# Patient Record
Sex: Male | Born: 1945 | Race: White | Hispanic: No | Marital: Married | Smoking: Never smoker
Health system: Southern US, Community
[De-identification: ages and names within clinical notes are randomized; demographics above are authoritative.]

## PROBLEM LIST (undated history)

## (undated) DIAGNOSIS — K649 Unspecified hemorrhoids: Secondary | ICD-10-CM

## (undated) DIAGNOSIS — E7800 Pure hypercholesterolemia, unspecified: Secondary | ICD-10-CM

## (undated) DIAGNOSIS — I10 Essential (primary) hypertension: Secondary | ICD-10-CM

## (undated) DIAGNOSIS — M1990 Unspecified osteoarthritis, unspecified site: Secondary | ICD-10-CM

## (undated) DIAGNOSIS — R350 Frequency of micturition: Secondary | ICD-10-CM

## (undated) DIAGNOSIS — M2550 Pain in unspecified joint: Secondary | ICD-10-CM

## (undated) DIAGNOSIS — C801 Malignant (primary) neoplasm, unspecified: Secondary | ICD-10-CM

## (undated) DIAGNOSIS — R112 Nausea with vomiting, unspecified: Secondary | ICD-10-CM

## (undated) DIAGNOSIS — F419 Anxiety disorder, unspecified: Secondary | ICD-10-CM

## (undated) DIAGNOSIS — Z9889 Other specified postprocedural states: Secondary | ICD-10-CM

## (undated) DIAGNOSIS — K219 Gastro-esophageal reflux disease without esophagitis: Secondary | ICD-10-CM

## (undated) DIAGNOSIS — E119 Type 2 diabetes mellitus without complications: Secondary | ICD-10-CM

## (undated) HISTORY — PX: KNEE ARTHROSCOPY: SUR90

## (undated) HISTORY — PX: BACK SURGERY: SHX140

---

## 2009-03-21 HISTORY — PX: HERNIA REPAIR: SHX51

## 2010-11-14 DIAGNOSIS — I10 Essential (primary) hypertension: Secondary | ICD-10-CM | POA: Insufficient documentation

## 2010-11-14 DIAGNOSIS — E785 Hyperlipidemia, unspecified: Secondary | ICD-10-CM | POA: Insufficient documentation

## 2010-11-14 DIAGNOSIS — J309 Allergic rhinitis, unspecified: Secondary | ICD-10-CM | POA: Insufficient documentation

## 2010-11-14 DIAGNOSIS — E1129 Type 2 diabetes mellitus with other diabetic kidney complication: Secondary | ICD-10-CM | POA: Insufficient documentation

## 2010-11-14 DIAGNOSIS — K219 Gastro-esophageal reflux disease without esophagitis: Secondary | ICD-10-CM | POA: Insufficient documentation

## 2011-01-23 DIAGNOSIS — L259 Unspecified contact dermatitis, unspecified cause: Secondary | ICD-10-CM | POA: Insufficient documentation

## 2011-04-08 DIAGNOSIS — J019 Acute sinusitis, unspecified: Secondary | ICD-10-CM | POA: Insufficient documentation

## 2011-05-26 DIAGNOSIS — E7800 Pure hypercholesterolemia, unspecified: Secondary | ICD-10-CM | POA: Diagnosis not present

## 2011-05-26 DIAGNOSIS — K2100 Gastro-esophageal reflux disease with esophagitis, without bleeding: Secondary | ICD-10-CM | POA: Diagnosis not present

## 2011-05-26 DIAGNOSIS — E119 Type 2 diabetes mellitus without complications: Secondary | ICD-10-CM | POA: Diagnosis not present

## 2011-05-26 DIAGNOSIS — I10 Essential (primary) hypertension: Secondary | ICD-10-CM | POA: Diagnosis not present

## 2011-06-27 DIAGNOSIS — R300 Dysuria: Secondary | ICD-10-CM | POA: Diagnosis not present

## 2011-07-30 DIAGNOSIS — M722 Plantar fascial fibromatosis: Secondary | ICD-10-CM | POA: Diagnosis not present

## 2011-08-11 DIAGNOSIS — D239 Other benign neoplasm of skin, unspecified: Secondary | ICD-10-CM | POA: Diagnosis not present

## 2011-08-11 DIAGNOSIS — L570 Actinic keratosis: Secondary | ICD-10-CM | POA: Diagnosis not present

## 2011-08-11 DIAGNOSIS — L821 Other seborrheic keratosis: Secondary | ICD-10-CM | POA: Diagnosis not present

## 2011-09-11 DIAGNOSIS — R5381 Other malaise: Secondary | ICD-10-CM | POA: Insufficient documentation

## 2011-11-10 DIAGNOSIS — M25519 Pain in unspecified shoulder: Secondary | ICD-10-CM | POA: Diagnosis not present

## 2011-11-24 DIAGNOSIS — K2100 Gastro-esophageal reflux disease with esophagitis, without bleeding: Secondary | ICD-10-CM | POA: Diagnosis not present

## 2011-11-24 DIAGNOSIS — E119 Type 2 diabetes mellitus without complications: Secondary | ICD-10-CM | POA: Diagnosis not present

## 2011-11-24 DIAGNOSIS — L821 Other seborrheic keratosis: Secondary | ICD-10-CM | POA: Diagnosis not present

## 2011-11-24 DIAGNOSIS — E7800 Pure hypercholesterolemia, unspecified: Secondary | ICD-10-CM | POA: Diagnosis not present

## 2011-11-24 DIAGNOSIS — L570 Actinic keratosis: Secondary | ICD-10-CM | POA: Diagnosis not present

## 2011-12-01 DIAGNOSIS — K5900 Constipation, unspecified: Secondary | ICD-10-CM | POA: Insufficient documentation

## 2011-12-01 DIAGNOSIS — R809 Proteinuria, unspecified: Secondary | ICD-10-CM | POA: Insufficient documentation

## 2012-01-17 DIAGNOSIS — M542 Cervicalgia: Secondary | ICD-10-CM | POA: Diagnosis not present

## 2012-01-20 DIAGNOSIS — S139XXA Sprain of joints and ligaments of unspecified parts of neck, initial encounter: Secondary | ICD-10-CM | POA: Insufficient documentation

## 2012-01-20 DIAGNOSIS — IMO0002 Reserved for concepts with insufficient information to code with codable children: Secondary | ICD-10-CM | POA: Insufficient documentation

## 2012-01-23 DIAGNOSIS — IMO0001 Reserved for inherently not codable concepts without codable children: Secondary | ICD-10-CM | POA: Diagnosis not present

## 2012-01-23 DIAGNOSIS — S139XXA Sprain of joints and ligaments of unspecified parts of neck, initial encounter: Secondary | ICD-10-CM | POA: Diagnosis not present

## 2012-01-26 DIAGNOSIS — IMO0001 Reserved for inherently not codable concepts without codable children: Secondary | ICD-10-CM | POA: Diagnosis not present

## 2012-01-26 DIAGNOSIS — S139XXA Sprain of joints and ligaments of unspecified parts of neck, initial encounter: Secondary | ICD-10-CM | POA: Diagnosis not present

## 2012-01-28 DIAGNOSIS — S139XXA Sprain of joints and ligaments of unspecified parts of neck, initial encounter: Secondary | ICD-10-CM | POA: Diagnosis not present

## 2012-01-28 DIAGNOSIS — IMO0001 Reserved for inherently not codable concepts without codable children: Secondary | ICD-10-CM | POA: Diagnosis not present

## 2012-01-30 DIAGNOSIS — IMO0001 Reserved for inherently not codable concepts without codable children: Secondary | ICD-10-CM | POA: Diagnosis not present

## 2012-01-30 DIAGNOSIS — S139XXA Sprain of joints and ligaments of unspecified parts of neck, initial encounter: Secondary | ICD-10-CM | POA: Diagnosis not present

## 2012-02-02 DIAGNOSIS — IMO0001 Reserved for inherently not codable concepts without codable children: Secondary | ICD-10-CM | POA: Diagnosis not present

## 2012-02-02 DIAGNOSIS — S139XXA Sprain of joints and ligaments of unspecified parts of neck, initial encounter: Secondary | ICD-10-CM | POA: Diagnosis not present

## 2012-02-04 DIAGNOSIS — S139XXA Sprain of joints and ligaments of unspecified parts of neck, initial encounter: Secondary | ICD-10-CM | POA: Diagnosis not present

## 2012-02-04 DIAGNOSIS — IMO0001 Reserved for inherently not codable concepts without codable children: Secondary | ICD-10-CM | POA: Diagnosis not present

## 2012-02-06 DIAGNOSIS — IMO0001 Reserved for inherently not codable concepts without codable children: Secondary | ICD-10-CM | POA: Diagnosis not present

## 2012-02-06 DIAGNOSIS — S139XXA Sprain of joints and ligaments of unspecified parts of neck, initial encounter: Secondary | ICD-10-CM | POA: Diagnosis not present

## 2012-02-09 DIAGNOSIS — IMO0001 Reserved for inherently not codable concepts without codable children: Secondary | ICD-10-CM | POA: Diagnosis not present

## 2012-02-09 DIAGNOSIS — S139XXA Sprain of joints and ligaments of unspecified parts of neck, initial encounter: Secondary | ICD-10-CM | POA: Diagnosis not present

## 2012-02-11 DIAGNOSIS — S139XXA Sprain of joints and ligaments of unspecified parts of neck, initial encounter: Secondary | ICD-10-CM | POA: Diagnosis not present

## 2012-02-11 DIAGNOSIS — IMO0001 Reserved for inherently not codable concepts without codable children: Secondary | ICD-10-CM | POA: Diagnosis not present

## 2012-02-13 DIAGNOSIS — IMO0001 Reserved for inherently not codable concepts without codable children: Secondary | ICD-10-CM | POA: Diagnosis not present

## 2012-02-13 DIAGNOSIS — S139XXA Sprain of joints and ligaments of unspecified parts of neck, initial encounter: Secondary | ICD-10-CM | POA: Diagnosis not present

## 2012-02-17 DIAGNOSIS — IMO0001 Reserved for inherently not codable concepts without codable children: Secondary | ICD-10-CM | POA: Diagnosis not present

## 2012-02-17 DIAGNOSIS — M542 Cervicalgia: Secondary | ICD-10-CM | POA: Diagnosis not present

## 2012-03-31 DIAGNOSIS — K648 Other hemorrhoids: Secondary | ICD-10-CM | POA: Diagnosis not present

## 2012-04-14 DIAGNOSIS — K648 Other hemorrhoids: Secondary | ICD-10-CM | POA: Diagnosis not present

## 2012-04-28 DIAGNOSIS — K602 Anal fissure, unspecified: Secondary | ICD-10-CM | POA: Diagnosis not present

## 2012-04-28 DIAGNOSIS — K648 Other hemorrhoids: Secondary | ICD-10-CM | POA: Diagnosis not present

## 2012-05-10 DIAGNOSIS — M25519 Pain in unspecified shoulder: Secondary | ICD-10-CM | POA: Diagnosis not present

## 2012-05-10 DIAGNOSIS — M19049 Primary osteoarthritis, unspecified hand: Secondary | ICD-10-CM | POA: Diagnosis not present

## 2012-05-10 DIAGNOSIS — M542 Cervicalgia: Secondary | ICD-10-CM | POA: Diagnosis not present

## 2012-05-10 DIAGNOSIS — IMO0002 Reserved for concepts with insufficient information to code with codable children: Secondary | ICD-10-CM | POA: Diagnosis not present

## 2012-05-15 DIAGNOSIS — J111 Influenza due to unidentified influenza virus with other respiratory manifestations: Secondary | ICD-10-CM | POA: Diagnosis not present

## 2012-06-01 DIAGNOSIS — K625 Hemorrhage of anus and rectum: Secondary | ICD-10-CM | POA: Diagnosis not present

## 2012-06-01 DIAGNOSIS — K5900 Constipation, unspecified: Secondary | ICD-10-CM | POA: Diagnosis not present

## 2012-06-21 DIAGNOSIS — D126 Benign neoplasm of colon, unspecified: Secondary | ICD-10-CM | POA: Diagnosis not present

## 2012-06-21 DIAGNOSIS — K5730 Diverticulosis of large intestine without perforation or abscess without bleeding: Secondary | ICD-10-CM | POA: Diagnosis not present

## 2012-06-21 DIAGNOSIS — K648 Other hemorrhoids: Secondary | ICD-10-CM | POA: Diagnosis not present

## 2012-08-19 DIAGNOSIS — R5381 Other malaise: Secondary | ICD-10-CM | POA: Diagnosis not present

## 2012-08-19 DIAGNOSIS — R42 Dizziness and giddiness: Secondary | ICD-10-CM | POA: Diagnosis not present

## 2012-08-19 DIAGNOSIS — F411 Generalized anxiety disorder: Secondary | ICD-10-CM | POA: Diagnosis not present

## 2012-08-19 DIAGNOSIS — R5383 Other fatigue: Secondary | ICD-10-CM | POA: Diagnosis not present

## 2012-09-10 DIAGNOSIS — K5289 Other specified noninfective gastroenteritis and colitis: Secondary | ICD-10-CM | POA: Diagnosis not present

## 2012-09-10 DIAGNOSIS — K529 Noninfective gastroenteritis and colitis, unspecified: Secondary | ICD-10-CM | POA: Insufficient documentation

## 2012-11-15 DIAGNOSIS — IMO0002 Reserved for concepts with insufficient information to code with codable children: Secondary | ICD-10-CM | POA: Diagnosis not present

## 2012-11-15 DIAGNOSIS — M75120 Complete rotator cuff tear or rupture of unspecified shoulder, not specified as traumatic: Secondary | ICD-10-CM | POA: Diagnosis not present

## 2012-11-15 DIAGNOSIS — M19049 Primary osteoarthritis, unspecified hand: Secondary | ICD-10-CM | POA: Diagnosis not present

## 2012-11-15 DIAGNOSIS — M542 Cervicalgia: Secondary | ICD-10-CM | POA: Diagnosis not present

## 2012-11-22 DIAGNOSIS — E559 Vitamin D deficiency, unspecified: Secondary | ICD-10-CM | POA: Insufficient documentation

## 2012-11-23 DIAGNOSIS — L821 Other seborrheic keratosis: Secondary | ICD-10-CM | POA: Diagnosis not present

## 2012-11-23 DIAGNOSIS — L570 Actinic keratosis: Secondary | ICD-10-CM | POA: Diagnosis not present

## 2012-11-23 DIAGNOSIS — D1800 Hemangioma unspecified site: Secondary | ICD-10-CM | POA: Diagnosis not present

## 2012-12-01 DIAGNOSIS — E119 Type 2 diabetes mellitus without complications: Secondary | ICD-10-CM | POA: Diagnosis not present

## 2012-12-01 DIAGNOSIS — K2100 Gastro-esophageal reflux disease with esophagitis, without bleeding: Secondary | ICD-10-CM | POA: Diagnosis not present

## 2012-12-01 DIAGNOSIS — M25519 Pain in unspecified shoulder: Secondary | ICD-10-CM | POA: Diagnosis not present

## 2012-12-01 DIAGNOSIS — E7800 Pure hypercholesterolemia, unspecified: Secondary | ICD-10-CM | POA: Diagnosis not present

## 2012-12-01 DIAGNOSIS — I10 Essential (primary) hypertension: Secondary | ICD-10-CM | POA: Diagnosis not present

## 2012-12-01 DIAGNOSIS — R809 Proteinuria, unspecified: Secondary | ICD-10-CM | POA: Diagnosis not present

## 2012-12-01 DIAGNOSIS — J309 Allergic rhinitis, unspecified: Secondary | ICD-10-CM | POA: Diagnosis not present

## 2012-12-07 DIAGNOSIS — M25519 Pain in unspecified shoulder: Secondary | ICD-10-CM | POA: Diagnosis not present

## 2013-02-18 DIAGNOSIS — Z23 Encounter for immunization: Secondary | ICD-10-CM | POA: Insufficient documentation

## 2013-05-09 DIAGNOSIS — M25519 Pain in unspecified shoulder: Secondary | ICD-10-CM | POA: Diagnosis not present

## 2013-05-09 DIAGNOSIS — M545 Low back pain, unspecified: Secondary | ICD-10-CM | POA: Diagnosis not present

## 2013-05-09 DIAGNOSIS — M542 Cervicalgia: Secondary | ICD-10-CM | POA: Diagnosis not present

## 2013-05-09 DIAGNOSIS — M19049 Primary osteoarthritis, unspecified hand: Secondary | ICD-10-CM | POA: Diagnosis not present

## 2013-05-24 DIAGNOSIS — T7492XA Unspecified child maltreatment, confirmed, initial encounter: Secondary | ICD-10-CM | POA: Insufficient documentation

## 2013-05-26 DIAGNOSIS — E7800 Pure hypercholesterolemia, unspecified: Secondary | ICD-10-CM | POA: Diagnosis not present

## 2013-05-26 DIAGNOSIS — I10 Essential (primary) hypertension: Secondary | ICD-10-CM | POA: Diagnosis not present

## 2013-05-26 DIAGNOSIS — K2100 Gastro-esophageal reflux disease with esophagitis, without bleeding: Secondary | ICD-10-CM | POA: Diagnosis not present

## 2013-05-26 DIAGNOSIS — E119 Type 2 diabetes mellitus without complications: Secondary | ICD-10-CM | POA: Diagnosis not present

## 2013-06-01 DIAGNOSIS — K2100 Gastro-esophageal reflux disease with esophagitis, without bleeding: Secondary | ICD-10-CM | POA: Insufficient documentation

## 2013-06-01 DIAGNOSIS — R809 Proteinuria, unspecified: Secondary | ICD-10-CM | POA: Diagnosis not present

## 2013-06-01 DIAGNOSIS — E7800 Pure hypercholesterolemia, unspecified: Secondary | ICD-10-CM | POA: Diagnosis not present

## 2013-06-01 DIAGNOSIS — I10 Essential (primary) hypertension: Secondary | ICD-10-CM | POA: Diagnosis not present

## 2013-06-01 DIAGNOSIS — E119 Type 2 diabetes mellitus without complications: Secondary | ICD-10-CM | POA: Diagnosis not present

## 2013-06-01 DIAGNOSIS — J309 Allergic rhinitis, unspecified: Secondary | ICD-10-CM | POA: Diagnosis not present

## 2013-08-31 DIAGNOSIS — M25819 Other specified joint disorders, unspecified shoulder: Secondary | ICD-10-CM | POA: Diagnosis not present

## 2013-08-31 DIAGNOSIS — M25519 Pain in unspecified shoulder: Secondary | ICD-10-CM | POA: Diagnosis not present

## 2013-09-15 DIAGNOSIS — M542 Cervicalgia: Secondary | ICD-10-CM | POA: Diagnosis not present

## 2013-09-15 DIAGNOSIS — M25819 Other specified joint disorders, unspecified shoulder: Secondary | ICD-10-CM | POA: Diagnosis not present

## 2013-11-08 DIAGNOSIS — M19019 Primary osteoarthritis, unspecified shoulder: Secondary | ICD-10-CM | POA: Diagnosis not present

## 2013-11-08 DIAGNOSIS — IMO0002 Reserved for concepts with insufficient information to code with codable children: Secondary | ICD-10-CM | POA: Diagnosis not present

## 2013-11-08 DIAGNOSIS — M25519 Pain in unspecified shoulder: Secondary | ICD-10-CM | POA: Diagnosis not present

## 2013-11-08 DIAGNOSIS — M25819 Other specified joint disorders, unspecified shoulder: Secondary | ICD-10-CM | POA: Diagnosis not present

## 2013-11-21 DIAGNOSIS — N410 Acute prostatitis: Secondary | ICD-10-CM | POA: Insufficient documentation

## 2013-11-21 DIAGNOSIS — E7800 Pure hypercholesterolemia, unspecified: Secondary | ICD-10-CM | POA: Insufficient documentation

## 2013-11-21 DIAGNOSIS — I10 Essential (primary) hypertension: Secondary | ICD-10-CM | POA: Insufficient documentation

## 2013-11-21 DIAGNOSIS — F411 Generalized anxiety disorder: Secondary | ICD-10-CM | POA: Insufficient documentation

## 2013-11-23 DIAGNOSIS — I10 Essential (primary) hypertension: Secondary | ICD-10-CM | POA: Diagnosis not present

## 2013-11-23 DIAGNOSIS — L821 Other seborrheic keratosis: Secondary | ICD-10-CM | POA: Diagnosis not present

## 2013-11-23 DIAGNOSIS — E119 Type 2 diabetes mellitus without complications: Secondary | ICD-10-CM | POA: Diagnosis not present

## 2013-11-23 DIAGNOSIS — N410 Acute prostatitis: Secondary | ICD-10-CM | POA: Diagnosis not present

## 2013-11-23 DIAGNOSIS — E7800 Pure hypercholesterolemia, unspecified: Secondary | ICD-10-CM | POA: Diagnosis not present

## 2013-11-23 DIAGNOSIS — F411 Generalized anxiety disorder: Secondary | ICD-10-CM | POA: Diagnosis not present

## 2013-11-23 DIAGNOSIS — L570 Actinic keratosis: Secondary | ICD-10-CM | POA: Diagnosis not present

## 2013-11-23 DIAGNOSIS — D1800 Hemangioma unspecified site: Secondary | ICD-10-CM | POA: Diagnosis not present

## 2013-11-30 DIAGNOSIS — R972 Elevated prostate specific antigen [PSA]: Secondary | ICD-10-CM | POA: Insufficient documentation

## 2013-12-05 DIAGNOSIS — M719 Bursopathy, unspecified: Secondary | ICD-10-CM | POA: Diagnosis not present

## 2013-12-05 DIAGNOSIS — M19019 Primary osteoarthritis, unspecified shoulder: Secondary | ICD-10-CM | POA: Diagnosis not present

## 2013-12-05 DIAGNOSIS — M25819 Other specified joint disorders, unspecified shoulder: Secondary | ICD-10-CM | POA: Diagnosis not present

## 2013-12-05 DIAGNOSIS — M67919 Unspecified disorder of synovium and tendon, unspecified shoulder: Secondary | ICD-10-CM | POA: Diagnosis not present

## 2013-12-07 DIAGNOSIS — R972 Elevated prostate specific antigen [PSA]: Secondary | ICD-10-CM | POA: Diagnosis not present

## 2013-12-07 DIAGNOSIS — R3915 Urgency of urination: Secondary | ICD-10-CM | POA: Diagnosis not present

## 2013-12-07 DIAGNOSIS — R351 Nocturia: Secondary | ICD-10-CM | POA: Diagnosis not present

## 2013-12-07 DIAGNOSIS — R3989 Other symptoms and signs involving the genitourinary system: Secondary | ICD-10-CM | POA: Diagnosis not present

## 2014-01-02 DIAGNOSIS — R3915 Urgency of urination: Secondary | ICD-10-CM | POA: Diagnosis not present

## 2014-01-02 DIAGNOSIS — R972 Elevated prostate specific antigen [PSA]: Secondary | ICD-10-CM | POA: Diagnosis not present

## 2014-01-09 DIAGNOSIS — J3489 Other specified disorders of nose and nasal sinuses: Secondary | ICD-10-CM | POA: Diagnosis not present

## 2014-01-09 DIAGNOSIS — R3915 Urgency of urination: Secondary | ICD-10-CM | POA: Diagnosis not present

## 2014-01-09 DIAGNOSIS — R972 Elevated prostate specific antigen [PSA]: Secondary | ICD-10-CM | POA: Diagnosis not present

## 2014-01-24 DIAGNOSIS — Z23 Encounter for immunization: Secondary | ICD-10-CM | POA: Insufficient documentation

## 2014-03-31 DIAGNOSIS — M7541 Impingement syndrome of right shoulder: Secondary | ICD-10-CM | POA: Diagnosis not present

## 2014-03-31 DIAGNOSIS — M19011 Primary osteoarthritis, right shoulder: Secondary | ICD-10-CM | POA: Diagnosis not present

## 2014-04-27 DIAGNOSIS — M545 Low back pain: Secondary | ICD-10-CM | POA: Diagnosis not present

## 2014-04-27 DIAGNOSIS — M75101 Unspecified rotator cuff tear or rupture of right shoulder, not specified as traumatic: Secondary | ICD-10-CM | POA: Diagnosis not present

## 2014-05-10 DIAGNOSIS — M25511 Pain in right shoulder: Secondary | ICD-10-CM | POA: Diagnosis not present

## 2014-05-22 DIAGNOSIS — M75101 Unspecified rotator cuff tear or rupture of right shoulder, not specified as traumatic: Secondary | ICD-10-CM | POA: Diagnosis not present

## 2014-05-23 DIAGNOSIS — I10 Essential (primary) hypertension: Secondary | ICD-10-CM | POA: Diagnosis not present

## 2014-05-23 DIAGNOSIS — K210 Gastro-esophageal reflux disease with esophagitis: Secondary | ICD-10-CM | POA: Diagnosis not present

## 2014-05-23 DIAGNOSIS — R801 Persistent proteinuria, unspecified: Secondary | ICD-10-CM | POA: Diagnosis not present

## 2014-05-23 DIAGNOSIS — E119 Type 2 diabetes mellitus without complications: Secondary | ICD-10-CM | POA: Diagnosis not present

## 2014-05-23 DIAGNOSIS — E1121 Type 2 diabetes mellitus with diabetic nephropathy: Secondary | ICD-10-CM | POA: Diagnosis not present

## 2014-05-23 DIAGNOSIS — E780 Pure hypercholesterolemia: Secondary | ICD-10-CM | POA: Diagnosis not present

## 2014-05-29 DIAGNOSIS — M7541 Impingement syndrome of right shoulder: Secondary | ICD-10-CM | POA: Diagnosis not present

## 2014-06-06 DIAGNOSIS — M66821 Spontaneous rupture of other tendons, right upper arm: Secondary | ICD-10-CM | POA: Diagnosis not present

## 2014-06-06 DIAGNOSIS — M24111 Other articular cartilage disorders, right shoulder: Secondary | ICD-10-CM | POA: Diagnosis not present

## 2014-06-06 DIAGNOSIS — M65821 Other synovitis and tenosynovitis, right upper arm: Secondary | ICD-10-CM | POA: Diagnosis not present

## 2014-06-06 DIAGNOSIS — G8918 Other acute postprocedural pain: Secondary | ICD-10-CM | POA: Diagnosis not present

## 2014-06-06 DIAGNOSIS — M19011 Primary osteoarthritis, right shoulder: Secondary | ICD-10-CM | POA: Diagnosis not present

## 2014-06-06 DIAGNOSIS — M7541 Impingement syndrome of right shoulder: Secondary | ICD-10-CM | POA: Diagnosis not present

## 2014-06-06 HISTORY — PX: SHOULDER SURGERY: SHX246

## 2014-06-16 DIAGNOSIS — M25611 Stiffness of right shoulder, not elsewhere classified: Secondary | ICD-10-CM | POA: Diagnosis not present

## 2014-06-16 DIAGNOSIS — M25411 Effusion, right shoulder: Secondary | ICD-10-CM | POA: Diagnosis not present

## 2014-06-16 DIAGNOSIS — R293 Abnormal posture: Secondary | ICD-10-CM | POA: Diagnosis not present

## 2014-06-16 DIAGNOSIS — M25511 Pain in right shoulder: Secondary | ICD-10-CM | POA: Diagnosis not present

## 2014-06-19 DIAGNOSIS — R293 Abnormal posture: Secondary | ICD-10-CM | POA: Diagnosis not present

## 2014-06-19 DIAGNOSIS — M25611 Stiffness of right shoulder, not elsewhere classified: Secondary | ICD-10-CM | POA: Diagnosis not present

## 2014-06-19 DIAGNOSIS — M25511 Pain in right shoulder: Secondary | ICD-10-CM | POA: Diagnosis not present

## 2014-06-19 DIAGNOSIS — M25411 Effusion, right shoulder: Secondary | ICD-10-CM | POA: Diagnosis not present

## 2014-06-20 DIAGNOSIS — M25611 Stiffness of right shoulder, not elsewhere classified: Secondary | ICD-10-CM | POA: Diagnosis not present

## 2014-06-20 DIAGNOSIS — M25511 Pain in right shoulder: Secondary | ICD-10-CM | POA: Diagnosis not present

## 2014-06-20 DIAGNOSIS — M25411 Effusion, right shoulder: Secondary | ICD-10-CM | POA: Diagnosis not present

## 2014-06-20 DIAGNOSIS — R293 Abnormal posture: Secondary | ICD-10-CM | POA: Diagnosis not present

## 2014-06-22 DIAGNOSIS — M25611 Stiffness of right shoulder, not elsewhere classified: Secondary | ICD-10-CM | POA: Diagnosis not present

## 2014-06-22 DIAGNOSIS — R293 Abnormal posture: Secondary | ICD-10-CM | POA: Diagnosis not present

## 2014-06-22 DIAGNOSIS — M25411 Effusion, right shoulder: Secondary | ICD-10-CM | POA: Diagnosis not present

## 2014-06-22 DIAGNOSIS — M25511 Pain in right shoulder: Secondary | ICD-10-CM | POA: Diagnosis not present

## 2014-06-26 DIAGNOSIS — M25511 Pain in right shoulder: Secondary | ICD-10-CM | POA: Diagnosis not present

## 2014-06-26 DIAGNOSIS — M25411 Effusion, right shoulder: Secondary | ICD-10-CM | POA: Diagnosis not present

## 2014-06-26 DIAGNOSIS — R293 Abnormal posture: Secondary | ICD-10-CM | POA: Diagnosis not present

## 2014-06-26 DIAGNOSIS — M25611 Stiffness of right shoulder, not elsewhere classified: Secondary | ICD-10-CM | POA: Diagnosis not present

## 2014-06-28 DIAGNOSIS — M25411 Effusion, right shoulder: Secondary | ICD-10-CM | POA: Diagnosis not present

## 2014-06-28 DIAGNOSIS — M25511 Pain in right shoulder: Secondary | ICD-10-CM | POA: Diagnosis not present

## 2014-06-28 DIAGNOSIS — R293 Abnormal posture: Secondary | ICD-10-CM | POA: Diagnosis not present

## 2014-06-28 DIAGNOSIS — M25611 Stiffness of right shoulder, not elsewhere classified: Secondary | ICD-10-CM | POA: Diagnosis not present

## 2014-06-30 DIAGNOSIS — M25611 Stiffness of right shoulder, not elsewhere classified: Secondary | ICD-10-CM | POA: Diagnosis not present

## 2014-06-30 DIAGNOSIS — M25511 Pain in right shoulder: Secondary | ICD-10-CM | POA: Diagnosis not present

## 2014-06-30 DIAGNOSIS — R293 Abnormal posture: Secondary | ICD-10-CM | POA: Diagnosis not present

## 2014-06-30 DIAGNOSIS — M25411 Effusion, right shoulder: Secondary | ICD-10-CM | POA: Diagnosis not present

## 2014-07-05 DIAGNOSIS — M25511 Pain in right shoulder: Secondary | ICD-10-CM | POA: Diagnosis not present

## 2014-07-05 DIAGNOSIS — M25611 Stiffness of right shoulder, not elsewhere classified: Secondary | ICD-10-CM | POA: Diagnosis not present

## 2014-07-05 DIAGNOSIS — R293 Abnormal posture: Secondary | ICD-10-CM | POA: Diagnosis not present

## 2014-07-05 DIAGNOSIS — M25411 Effusion, right shoulder: Secondary | ICD-10-CM | POA: Diagnosis not present

## 2014-07-07 DIAGNOSIS — M25511 Pain in right shoulder: Secondary | ICD-10-CM | POA: Diagnosis not present

## 2014-07-07 DIAGNOSIS — R293 Abnormal posture: Secondary | ICD-10-CM | POA: Diagnosis not present

## 2014-07-07 DIAGNOSIS — M25411 Effusion, right shoulder: Secondary | ICD-10-CM | POA: Diagnosis not present

## 2014-07-07 DIAGNOSIS — M25611 Stiffness of right shoulder, not elsewhere classified: Secondary | ICD-10-CM | POA: Diagnosis not present

## 2014-07-07 DIAGNOSIS — R972 Elevated prostate specific antigen [PSA]: Secondary | ICD-10-CM | POA: Diagnosis not present

## 2014-07-11 DIAGNOSIS — M25511 Pain in right shoulder: Secondary | ICD-10-CM | POA: Diagnosis not present

## 2014-07-11 DIAGNOSIS — M25411 Effusion, right shoulder: Secondary | ICD-10-CM | POA: Diagnosis not present

## 2014-07-11 DIAGNOSIS — M25611 Stiffness of right shoulder, not elsewhere classified: Secondary | ICD-10-CM | POA: Diagnosis not present

## 2014-07-11 DIAGNOSIS — R293 Abnormal posture: Secondary | ICD-10-CM | POA: Diagnosis not present

## 2014-07-12 DIAGNOSIS — M25511 Pain in right shoulder: Secondary | ICD-10-CM | POA: Diagnosis not present

## 2014-07-12 DIAGNOSIS — M25411 Effusion, right shoulder: Secondary | ICD-10-CM | POA: Diagnosis not present

## 2014-07-12 DIAGNOSIS — R293 Abnormal posture: Secondary | ICD-10-CM | POA: Diagnosis not present

## 2014-07-12 DIAGNOSIS — M25611 Stiffness of right shoulder, not elsewhere classified: Secondary | ICD-10-CM | POA: Diagnosis not present

## 2014-07-13 DIAGNOSIS — R3989 Other symptoms and signs involving the genitourinary system: Secondary | ICD-10-CM | POA: Diagnosis not present

## 2014-07-13 DIAGNOSIS — R972 Elevated prostate specific antigen [PSA]: Secondary | ICD-10-CM | POA: Diagnosis not present

## 2014-07-14 DIAGNOSIS — M25611 Stiffness of right shoulder, not elsewhere classified: Secondary | ICD-10-CM | POA: Diagnosis not present

## 2014-07-14 DIAGNOSIS — M25411 Effusion, right shoulder: Secondary | ICD-10-CM | POA: Diagnosis not present

## 2014-07-14 DIAGNOSIS — R293 Abnormal posture: Secondary | ICD-10-CM | POA: Diagnosis not present

## 2014-07-14 DIAGNOSIS — M25511 Pain in right shoulder: Secondary | ICD-10-CM | POA: Diagnosis not present

## 2014-07-17 DIAGNOSIS — M25411 Effusion, right shoulder: Secondary | ICD-10-CM | POA: Diagnosis not present

## 2014-07-17 DIAGNOSIS — R293 Abnormal posture: Secondary | ICD-10-CM | POA: Diagnosis not present

## 2014-07-17 DIAGNOSIS — M25511 Pain in right shoulder: Secondary | ICD-10-CM | POA: Diagnosis not present

## 2014-07-17 DIAGNOSIS — M25611 Stiffness of right shoulder, not elsewhere classified: Secondary | ICD-10-CM | POA: Diagnosis not present

## 2014-07-21 DIAGNOSIS — M25511 Pain in right shoulder: Secondary | ICD-10-CM | POA: Diagnosis not present

## 2014-07-21 DIAGNOSIS — R293 Abnormal posture: Secondary | ICD-10-CM | POA: Diagnosis not present

## 2014-07-21 DIAGNOSIS — M25611 Stiffness of right shoulder, not elsewhere classified: Secondary | ICD-10-CM | POA: Diagnosis not present

## 2014-07-21 DIAGNOSIS — M25411 Effusion, right shoulder: Secondary | ICD-10-CM | POA: Diagnosis not present

## 2014-08-09 DIAGNOSIS — L03031 Cellulitis of right toe: Secondary | ICD-10-CM | POA: Diagnosis not present

## 2014-08-09 DIAGNOSIS — L600 Ingrowing nail: Secondary | ICD-10-CM | POA: Diagnosis not present

## 2014-08-09 DIAGNOSIS — M79674 Pain in right toe(s): Secondary | ICD-10-CM | POA: Diagnosis not present

## 2014-08-23 DIAGNOSIS — L03031 Cellulitis of right toe: Secondary | ICD-10-CM | POA: Diagnosis not present

## 2014-08-23 DIAGNOSIS — M79674 Pain in right toe(s): Secondary | ICD-10-CM | POA: Diagnosis not present

## 2014-08-23 DIAGNOSIS — L600 Ingrowing nail: Secondary | ICD-10-CM | POA: Diagnosis not present

## 2014-09-21 DIAGNOSIS — M1711 Unilateral primary osteoarthritis, right knee: Secondary | ICD-10-CM | POA: Diagnosis not present

## 2014-09-21 DIAGNOSIS — M1811 Unilateral primary osteoarthritis of first carpometacarpal joint, right hand: Secondary | ICD-10-CM | POA: Diagnosis not present

## 2014-10-05 DIAGNOSIS — L600 Ingrowing nail: Secondary | ICD-10-CM | POA: Diagnosis not present

## 2014-10-05 DIAGNOSIS — M79675 Pain in left toe(s): Secondary | ICD-10-CM | POA: Diagnosis not present

## 2014-11-14 DIAGNOSIS — K210 Gastro-esophageal reflux disease with esophagitis: Secondary | ICD-10-CM | POA: Diagnosis not present

## 2014-11-14 DIAGNOSIS — I10 Essential (primary) hypertension: Secondary | ICD-10-CM | POA: Diagnosis not present

## 2014-11-14 DIAGNOSIS — E1121 Type 2 diabetes mellitus with diabetic nephropathy: Secondary | ICD-10-CM | POA: Diagnosis not present

## 2014-11-14 DIAGNOSIS — E780 Pure hypercholesterolemia: Secondary | ICD-10-CM | POA: Diagnosis not present

## 2014-11-22 DIAGNOSIS — Z23 Encounter for immunization: Secondary | ICD-10-CM | POA: Diagnosis not present

## 2014-11-22 DIAGNOSIS — E780 Pure hypercholesterolemia: Secondary | ICD-10-CM | POA: Diagnosis not present

## 2014-11-22 DIAGNOSIS — E119 Type 2 diabetes mellitus without complications: Secondary | ICD-10-CM | POA: Diagnosis not present

## 2014-11-22 DIAGNOSIS — Z1389 Encounter for screening for other disorder: Secondary | ICD-10-CM | POA: Diagnosis not present

## 2014-11-22 DIAGNOSIS — E1121 Type 2 diabetes mellitus with diabetic nephropathy: Secondary | ICD-10-CM | POA: Diagnosis not present

## 2014-11-22 DIAGNOSIS — R801 Persistent proteinuria, unspecified: Secondary | ICD-10-CM | POA: Diagnosis not present

## 2014-11-22 DIAGNOSIS — I10 Essential (primary) hypertension: Secondary | ICD-10-CM | POA: Diagnosis not present

## 2014-11-22 DIAGNOSIS — K210 Gastro-esophageal reflux disease with esophagitis: Secondary | ICD-10-CM | POA: Diagnosis not present

## 2014-11-27 DIAGNOSIS — D1800 Hemangioma unspecified site: Secondary | ICD-10-CM | POA: Diagnosis not present

## 2014-11-27 DIAGNOSIS — L821 Other seborrheic keratosis: Secondary | ICD-10-CM | POA: Diagnosis not present

## 2014-11-27 DIAGNOSIS — L570 Actinic keratosis: Secondary | ICD-10-CM | POA: Diagnosis not present

## 2015-01-10 DIAGNOSIS — M1711 Unilateral primary osteoarthritis, right knee: Secondary | ICD-10-CM | POA: Diagnosis not present

## 2015-01-10 DIAGNOSIS — M1811 Unilateral primary osteoarthritis of first carpometacarpal joint, right hand: Secondary | ICD-10-CM | POA: Diagnosis not present

## 2015-01-17 DIAGNOSIS — M1711 Unilateral primary osteoarthritis, right knee: Secondary | ICD-10-CM | POA: Diagnosis not present

## 2015-01-31 DIAGNOSIS — M1711 Unilateral primary osteoarthritis, right knee: Secondary | ICD-10-CM | POA: Diagnosis not present

## 2015-02-07 DIAGNOSIS — M1711 Unilateral primary osteoarthritis, right knee: Secondary | ICD-10-CM | POA: Diagnosis not present

## 2015-02-08 DIAGNOSIS — Z23 Encounter for immunization: Secondary | ICD-10-CM | POA: Diagnosis not present

## 2015-02-14 DIAGNOSIS — M1711 Unilateral primary osteoarthritis, right knee: Secondary | ICD-10-CM | POA: Diagnosis not present

## 2015-03-14 DIAGNOSIS — M1711 Unilateral primary osteoarthritis, right knee: Secondary | ICD-10-CM | POA: Diagnosis not present

## 2015-04-30 DIAGNOSIS — M5416 Radiculopathy, lumbar region: Secondary | ICD-10-CM | POA: Diagnosis not present

## 2015-04-30 DIAGNOSIS — M1811 Unilateral primary osteoarthritis of first carpometacarpal joint, right hand: Secondary | ICD-10-CM | POA: Diagnosis not present

## 2015-04-30 DIAGNOSIS — G039 Meningitis, unspecified: Secondary | ICD-10-CM | POA: Diagnosis not present

## 2015-05-24 DIAGNOSIS — E1121 Type 2 diabetes mellitus with diabetic nephropathy: Secondary | ICD-10-CM | POA: Diagnosis not present

## 2015-05-24 DIAGNOSIS — I10 Essential (primary) hypertension: Secondary | ICD-10-CM | POA: Diagnosis not present

## 2015-05-24 DIAGNOSIS — E7800 Pure hypercholesterolemia, unspecified: Secondary | ICD-10-CM | POA: Diagnosis not present

## 2015-05-24 DIAGNOSIS — K210 Gastro-esophageal reflux disease with esophagitis: Secondary | ICD-10-CM | POA: Diagnosis not present

## 2015-05-28 DIAGNOSIS — E1121 Type 2 diabetes mellitus with diabetic nephropathy: Secondary | ICD-10-CM | POA: Diagnosis not present

## 2015-05-28 DIAGNOSIS — K210 Gastro-esophageal reflux disease with esophagitis: Secondary | ICD-10-CM | POA: Diagnosis not present

## 2015-05-28 DIAGNOSIS — F411 Generalized anxiety disorder: Secondary | ICD-10-CM | POA: Diagnosis not present

## 2015-05-28 DIAGNOSIS — I10 Essential (primary) hypertension: Secondary | ICD-10-CM | POA: Diagnosis not present

## 2015-05-28 DIAGNOSIS — J3089 Other allergic rhinitis: Secondary | ICD-10-CM | POA: Diagnosis not present

## 2015-05-28 DIAGNOSIS — E782 Mixed hyperlipidemia: Secondary | ICD-10-CM | POA: Diagnosis not present

## 2015-06-25 DIAGNOSIS — R05 Cough: Secondary | ICD-10-CM | POA: Insufficient documentation

## 2015-06-25 DIAGNOSIS — R059 Cough, unspecified: Secondary | ICD-10-CM | POA: Insufficient documentation

## 2015-06-26 DIAGNOSIS — J019 Acute sinusitis, unspecified: Secondary | ICD-10-CM | POA: Diagnosis not present

## 2015-06-26 DIAGNOSIS — R05 Cough: Secondary | ICD-10-CM | POA: Diagnosis not present

## 2015-07-03 DIAGNOSIS — H60399 Other infective otitis externa, unspecified ear: Secondary | ICD-10-CM | POA: Insufficient documentation

## 2015-07-04 DIAGNOSIS — H6092 Unspecified otitis externa, left ear: Secondary | ICD-10-CM | POA: Diagnosis not present

## 2015-07-18 DIAGNOSIS — C801 Malignant (primary) neoplasm, unspecified: Secondary | ICD-10-CM

## 2015-07-18 HISTORY — PX: PROSTATE BIOPSY: SHX241

## 2015-07-18 HISTORY — DX: Malignant (primary) neoplasm, unspecified: C80.1

## 2015-07-24 DIAGNOSIS — M545 Low back pain: Secondary | ICD-10-CM | POA: Diagnosis not present

## 2015-07-24 DIAGNOSIS — M5442 Lumbago with sciatica, left side: Secondary | ICD-10-CM | POA: Diagnosis not present

## 2015-08-01 DIAGNOSIS — Z0000 Encounter for general adult medical examination without abnormal findings: Secondary | ICD-10-CM | POA: Diagnosis not present

## 2015-08-01 DIAGNOSIS — N402 Nodular prostate without lower urinary tract symptoms: Secondary | ICD-10-CM | POA: Diagnosis not present

## 2015-08-01 DIAGNOSIS — R972 Elevated prostate specific antigen [PSA]: Secondary | ICD-10-CM | POA: Diagnosis not present

## 2015-08-07 DIAGNOSIS — M5442 Lumbago with sciatica, left side: Secondary | ICD-10-CM | POA: Diagnosis not present

## 2015-08-14 DIAGNOSIS — N402 Nodular prostate without lower urinary tract symptoms: Secondary | ICD-10-CM | POA: Diagnosis not present

## 2015-08-14 DIAGNOSIS — R972 Elevated prostate specific antigen [PSA]: Secondary | ICD-10-CM | POA: Diagnosis not present

## 2015-08-14 DIAGNOSIS — C61 Malignant neoplasm of prostate: Secondary | ICD-10-CM | POA: Diagnosis not present

## 2015-09-04 DIAGNOSIS — C61 Malignant neoplasm of prostate: Secondary | ICD-10-CM | POA: Diagnosis not present

## 2015-09-04 DIAGNOSIS — Z0000 Encounter for general adult medical examination without abnormal findings: Secondary | ICD-10-CM | POA: Diagnosis not present

## 2015-09-10 DIAGNOSIS — C61 Malignant neoplasm of prostate: Secondary | ICD-10-CM | POA: Diagnosis not present

## 2015-09-10 DIAGNOSIS — M6281 Muscle weakness (generalized): Secondary | ICD-10-CM | POA: Diagnosis not present

## 2015-09-11 ENCOUNTER — Other Ambulatory Visit: Payer: Self-pay | Admitting: Urology

## 2015-09-12 DIAGNOSIS — H524 Presbyopia: Secondary | ICD-10-CM | POA: Diagnosis not present

## 2015-09-12 DIAGNOSIS — E119 Type 2 diabetes mellitus without complications: Secondary | ICD-10-CM | POA: Diagnosis not present

## 2015-09-12 DIAGNOSIS — H5203 Hypermetropia, bilateral: Secondary | ICD-10-CM | POA: Diagnosis not present

## 2015-09-12 DIAGNOSIS — H52221 Regular astigmatism, right eye: Secondary | ICD-10-CM | POA: Diagnosis not present

## 2015-09-17 HISTORY — PX: PROSTATE SURGERY: SHX751

## 2015-09-18 DIAGNOSIS — M6281 Muscle weakness (generalized): Secondary | ICD-10-CM | POA: Diagnosis not present

## 2015-09-18 DIAGNOSIS — C61 Malignant neoplasm of prostate: Secondary | ICD-10-CM | POA: Diagnosis not present

## 2015-09-19 DIAGNOSIS — E785 Hyperlipidemia, unspecified: Secondary | ICD-10-CM | POA: Diagnosis not present

## 2015-09-19 DIAGNOSIS — Z803 Family history of malignant neoplasm of breast: Secondary | ICD-10-CM | POA: Diagnosis not present

## 2015-09-19 DIAGNOSIS — K219 Gastro-esophageal reflux disease without esophagitis: Secondary | ICD-10-CM | POA: Diagnosis not present

## 2015-09-19 DIAGNOSIS — I10 Essential (primary) hypertension: Secondary | ICD-10-CM | POA: Diagnosis not present

## 2015-09-19 DIAGNOSIS — C61 Malignant neoplasm of prostate: Secondary | ICD-10-CM | POA: Diagnosis not present

## 2015-09-19 DIAGNOSIS — E119 Type 2 diabetes mellitus without complications: Secondary | ICD-10-CM | POA: Diagnosis not present

## 2015-09-19 DIAGNOSIS — Z79899 Other long term (current) drug therapy: Secondary | ICD-10-CM | POA: Diagnosis not present

## 2015-09-19 DIAGNOSIS — Z7982 Long term (current) use of aspirin: Secondary | ICD-10-CM | POA: Diagnosis not present

## 2015-09-19 DIAGNOSIS — F419 Anxiety disorder, unspecified: Secondary | ICD-10-CM | POA: Diagnosis not present

## 2015-10-04 ENCOUNTER — Other Ambulatory Visit (HOSPITAL_COMMUNITY): Payer: Self-pay | Admitting: *Deleted

## 2015-10-04 NOTE — Patient Instructions (Addendum)
Jacob Nash  10/04/2015   Your procedure is scheduled on: 10-11-15  Report to [facility]  Entrance take [facility]  elevators to 3rd floor to  [place] at 515 AM.  Call this number if you have problems the morning of surgery [phone]   Remember: ONLY 1 PERSON MAY GO WITH YOU TO SHORT STAY TO GET  READY MORNING OF [name].  Do not eat food :After MidnighT Tuesday NIGHT, CLEAR LIQUIDS ALL DAY Wednesday, NO CLEAR LIQUIDS AFTER MIDNIGHT Wednesday NIGHT. FOLLOW ALL BOWEL PREP INSTRUCTIONS FROM DR Jacob Nash.      Take these medicines the morning of surgery with A SIP OF WATER: ALPRAZOLAM (XANAX), FLONASE NASAL SPRAY IF NEEDED, LORATADINE (CLARITIN), OMEPRAZOLE (PRILOSEC) DO NOT TAKE ANY DIABETIC MEDICATIONS DAY OF YOUR SURGERY                               You may not have any metal on your body including hair pins and              piercings  Do not wear jewelry, make-up, lotions, powders or perfumes, deodorant             Do not wear nail polish.  Do not shave  48 hours prior to surgery.              Men may shave face and neck.   Do not bring valuables to the hospital. [name].  Contacts, dentures or bridgework may not be worn into surgery.  Leave suitcase in the car. After surgery it may be brought to your room.     Patients discharged the day of surgery will not be allowed to drive home.  Name and phone number of your driver:  Special Instructions: N/A              Please read over the following fact sheets you were given: _____________________________________________________________________                CLEAR LIQUID DIET   Foods Allowed                                                                     Foods Excluded  Coffee and tea, regular and decaf                             liquids that you cannot  Plain Jell-O in any flavor                                             see  through such as: Fruit ices (not with fruit pulp)  milk, soups, orange juice  Iced Popsicles                                    All solid food Carbonated beverages, regular and diet                                    Cranberry, grape and apple juices Sports drinks like Gatorade Lightly seasoned clear broth or consume(fat free) Sugar, honey syrup  Sample Menu Breakfast                                Lunch                                     Supper Cranberry juice                    Beef broth                            Chicken broth Jell-O                                     Grape juice                           Apple juice Coffee or tea                        Jell-O                                      Popsicle                                                Coffee or tea                        Coffee or tea  _____________________________________________________________________  [facility] - Preparing for Surgery Before surgery, you can play an important role.  Because skin is not sterile, your skin needs to be as free of germs as possible.  You can reduce the number of germs on your skin by washing with CHG (chlorahexidine gluconate) soap before surgery.  CHG is an antiseptic cleaner which kills germs and bonds with the skin to continue killing germs even after washing. Please DO NOT use if you have an allergy to CHG or antibacterial soaps.  If your skin becomes reddened/irritated stop using the CHG and inform your nurse when you arrive at Short Stay. Do not shave (including legs and underarms) for at least 48 hours prior to the first CHG shower.  You may shave your face/neck. Please follow these instructions carefully:  1.  Shower with CHG Soap the night before surgery and the  morning of Surgery.  2.  If you choose to wash  your hair, wash your hair first as usual with your  normal  shampoo.  3.  After you shampoo, rinse your hair and body thoroughly to  remove the  shampoo.                           4.  Use CHG as you would any other liquid soap.  You can apply chg directly  to the skin and wash                       Gently with a scrungie or clean washcloth.  5.  Apply the CHG Soap to your body ONLY FROM THE NECK DOWN.   Do not use on face/ open                           Wound or open sores. Avoid contact with eyes, ears mouth and genitals (private parts).                       Wash face,  Genitals (private parts) with your normal soap.             6.  Wash thoroughly, paying special attention to the area where your surgery  will be performed.  7.  Thoroughly rinse your body with warm water from the neck down.  8.  DO NOT shower/wash with your normal soap after using and rinsing off  the CHG Soap.                9.  Pat yourself dry with a clean towel.            10.  Wear clean pajamas.            11.  Place clean sheets on your bed the night of your first shower and do not  sleep with pets. Day of Surgery : Do not apply any lotions/deodorants the morning of surgery.  Please wear clean clothes to the hospital/surgery center.  FAILURE TO FOLLOW THESE INSTRUCTIONS MAY RESULT IN THE CANCELLATION OF YOUR SURGERY PATIENT SIGNATURE_________________________________  NURSE SIGNATURE__________________________________  ________________________________________________________________________   Jacob Nash  An incentive spirometer is a tool that can help keep your lungs clear and active. This tool measures how well you are filling your lungs with each breath. Taking long deep breaths may help reverse or decrease the chance of developing breathing (pulmonary) problems (especially infection) following:  A long period of time when you are unable to move or be active. BEFORE THE PROCEDURE   If the spirometer includes an indicator to show your best effort, your nurse or respiratory therapist will set it to a desired goal.  If possible, sit  up straight or lean slightly forward. Try not to slouch.  Hold the incentive spirometer in an upright position. INSTRUCTIONS FOR USE   Sit on the edge of your bed if possible, or sit up as far as you can in bed or on a chair.  Hold the incentive spirometer in an upright position.  Breathe out normally.  Place the mouthpiece in your mouth and seal your lips tightly around it.  Breathe in slowly and as deeply as possible, raising the piston or the ball toward the top of the column.  Hold your breath for 3-5 seconds or for as long as possible. Allow the piston or ball to fall to the bottom  of the column.  Remove the mouthpiece from your mouth and breathe out normally.  Rest for a few seconds and repeat Steps 1 through 7 at least 10 times every 1-2 hours when you are awake. Take your time and take a few normal breaths between deep breaths.  The spirometer may include an indicator to show your best effort. Use the indicator as a goal to work toward during each repetition.  After each set of 10 deep breaths, practice coughing to be sure your lungs are clear. If you have an incision (the cut made at the time of surgery), support your incision when coughing by placing a pillow or rolled up towels firmly against it. Once you are able to get out of bed, walk around indoors and cough well. You may stop using the incentive spirometer when instructed by your caregiver.  RISKS AND COMPLICATIONS  Take your time so you do not get dizzy or light-headed.  If you are in pain, you may need to take or ask for pain medication before doing incentive spirometry. It is harder to take a deep breath if you are having pain. AFTER USE  Rest and breathe slowly and easily.  It can be helpful to keep track of a log of your progress. Your caregiver can provide you with a simple table to help with this. If you are using the spirometer at home, follow these instructions: [place] IF:   You are having  difficultly using the spirometer.  You have trouble using the spirometer as often as instructed.  Your pain medication is not giving enough relief while using the spirometer.  You develop fever of 100.5 F (38.1 C) or higher. SEEK IMMEDIATE MEDICAL CARE IF:   You cough up bloody sputum that had not been present before.  You develop fever of 102 F (38.9 C) or greater.  You develop worsening pain at or near the incision site. MAKE SURE YOU:   Understand these instructions.  Will watch your condition.  Will get help right away if you are not doing well or get worse. Document Released: 09/15/2006 Document Revised: 07/28/2011 Document Reviewed: 11/16/2006 ExitCare Patient Information 2014 Exit[employer], [place].   ________________________________________________________________________  WHAT IS A BLOOD TRANSFUSION? Blood Transfusion Information  A transfusion is the replacement of blood or some of its parts. Blood is made up of multiple cells which provide different functions.  Red blood cells carry oxygen and are used for blood loss replacement.  White blood cells fight against infection.  Platelets control bleeding.  Plasma helps clot blood.  Other blood products are available for specialized needs, such as hemophilia or other clotting disorders. BEFORE THE TRANSFUSION  Who gives blood for transfusions?   Healthy volunteers who are fully evaluated to make sure their blood is safe. This is blood bank blood. Transfusion therapy is the safest it has ever been in the practice of medicine. Before blood is taken from a donor, a complete history is taken to make sure that person has no history of diseases nor engages in risky social behavior (examples are intravenous drug use or sexual activity with multiple partners). The donor's travel history is screened to minimize risk of transmitting infections, such as malaria. The donated blood is tested for signs of infectious diseases, such as  HIV and hepatitis. The blood is then tested to be sure it is compatible with you in order to minimize the chance of a transfusion reaction. If you or a relative donates blood, this is  often done in anticipation of surgery and is not appropriate for emergency situations. It takes many days to process the donated blood. RISKS AND COMPLICATIONS Although transfusion therapy is very safe and saves many lives, the main dangers of transfusion include:   Getting an infectious disease.  Developing a transfusion reaction. This is an allergic reaction to something in the blood you were given. Every precaution is taken to prevent this. The decision to have a blood transfusion has been considered carefully by your caregiver before blood is given. Blood is not given unless the benefits outweigh the risks. AFTER THE TRANSFUSION  Right after receiving a blood transfusion, you will usually feel much better and more energetic. This is especially true if your red blood cells have gotten low (anemic). The transfusion raises the level of the red blood cells which carry oxygen, and this usually causes an energy increase.  The nurse administering the transfusion will monitor you carefully for complications. HOME CARE INSTRUCTIONS  No special instructions are needed after a transfusion. You may find your energy is better. Speak with your caregiver about any limitations on activity for underlying diseases you may have. SEEK MEDICAL CARE IF:   Your condition is not improving after your transfusion.  You develop redness or irritation at the intravenous (IV) site. SEEK IMMEDIATE MEDICAL CARE IF:  Any of the following symptoms occur over the next 12 hours:  Shaking chills.  You have a temperature by mouth above 102 F (38.9 C), not controlled by medicine.  Chest, back, or muscle pain.  People around you feel you are not acting correctly or are confused.  Shortness of breath or difficulty breathing.  Dizziness  and fainting.  You get a rash or develop hives.  You have a decrease in urine output.  Your urine turns a dark color or changes to pink, red, or brown. Any of the following symptoms occur over the next 10 days:  You have a temperature by mouth above 102 F (38.9 C), not controlled by medicine.  Shortness of breath.  Weakness after normal activity.  The white part of the eye turns yellow (jaundice).  You have a decrease in the amount of urine or are urinating less often.  Your urine turns a dark color or changes to pink, red, or brown. Document Released: 05/02/2000 Document Revised: 07/28/2011 Document Reviewed: 12/20/2007 [facility] Patient Information 2014 [place], [place].  _______________________________________________________________________

## 2015-10-08 ENCOUNTER — Encounter (HOSPITAL_COMMUNITY)
Admission: RE | Admit: 2015-10-08 | Discharge: 2015-10-08 | Disposition: A | Discharge status: Home / Self Care | Payer: Medicare Other | Source: Ambulatory Visit | Attending: Urology | Admitting: Urology

## 2015-10-08 ENCOUNTER — Encounter (HOSPITAL_COMMUNITY): Payer: Self-pay

## 2015-10-08 ENCOUNTER — Ambulatory Visit (HOSPITAL_COMMUNITY)
Admission: RE | Admit: 2015-10-08 | Discharge: 2015-10-08 | Disposition: A | Discharge status: Home / Self Care | Payer: Medicare Other | Source: Ambulatory Visit | Attending: Urology | Admitting: Urology

## 2015-10-08 DIAGNOSIS — C61 Malignant neoplasm of prostate: Secondary | ICD-10-CM | POA: Diagnosis not present

## 2015-10-08 DIAGNOSIS — I10 Essential (primary) hypertension: Secondary | ICD-10-CM | POA: Diagnosis not present

## 2015-10-08 DIAGNOSIS — Z01818 Encounter for other preprocedural examination: Secondary | ICD-10-CM

## 2015-10-08 DIAGNOSIS — E785 Hyperlipidemia, unspecified: Secondary | ICD-10-CM | POA: Diagnosis not present

## 2015-10-08 DIAGNOSIS — E119 Type 2 diabetes mellitus without complications: Secondary | ICD-10-CM | POA: Diagnosis not present

## 2015-10-08 DIAGNOSIS — R918 Other nonspecific abnormal finding of lung field: Secondary | ICD-10-CM | POA: Insufficient documentation

## 2015-10-08 DIAGNOSIS — J9811 Atelectasis: Secondary | ICD-10-CM | POA: Diagnosis not present

## 2015-10-08 HISTORY — DX: Gastro-esophageal reflux disease without esophagitis: K21.9

## 2015-10-08 HISTORY — DX: Unspecified hemorrhoids: K64.9

## 2015-10-08 HISTORY — DX: Nausea with vomiting, unspecified: R11.2

## 2015-10-08 HISTORY — DX: Type 2 diabetes mellitus without complications: E11.9

## 2015-10-08 HISTORY — DX: Unspecified osteoarthritis, unspecified site: M19.90

## 2015-10-08 HISTORY — DX: Pain in unspecified joint: M25.50

## 2015-10-08 HISTORY — DX: Other specified postprocedural states: Z98.890

## 2015-10-08 HISTORY — DX: Malignant (primary) neoplasm, unspecified: C80.1

## 2015-10-08 HISTORY — DX: Essential (primary) hypertension: I10

## 2015-10-08 HISTORY — DX: Pure hypercholesterolemia, unspecified: E78.00

## 2015-10-08 HISTORY — DX: Frequency of micturition: R35.0

## 2015-10-08 LAB — BASIC METABOLIC PANEL
ANION GAP: 5 (ref 5–15)
BUN: 9 mg/dL (ref 6–20)
CALCIUM: 9.4 mg/dL (ref 8.9–10.3)
CO2: 30 mmol/L (ref 22–32)
Chloride: 103 mmol/L (ref 101–111)
Creatinine, Ser: 0.72 mg/dL (ref 0.61–1.24)
GFR calc Af Amer: >60 mL/min (ref >60)
GLUCOSE: 117 mg/dL — AB (ref 65–99)
POTASSIUM: 4.4 mmol/L (ref 3.5–5.1)
SODIUM: 138 mmol/L (ref 135–145)

## 2015-10-08 LAB — CBC
HEMATOCRIT: 40.8 % (ref 39.0–52.0)
HEMOGLOBIN: 13.7 g/dL (ref 13.0–17.0)
MCH: 30.3 pg (ref 26.0–34.0)
MCHC: 33.6 g/dL (ref 30.0–36.0)
MCV: 90.3 fL (ref 78.0–100.0)
Platelets: 272 10*3/uL (ref 150–400)
RBC: 4.52 MIL/uL (ref 4.22–5.81)
RDW: 13.3 % (ref 11.5–15.5)
WBC: 5.5 10*3/uL (ref 4.0–10.5)

## 2015-10-08 LAB — ABO/RH: ABO/RH(D): A POS

## 2015-10-08 IMAGING — CR DG CHEST 2V
2 series · 2 of 2 positions shown · non-contrast
Comparison: [DATE].

CLINICAL DATA: Prostatectomy.  Hypertension.

EXAM:
CHEST  2 VIEW

[w chest pa *]
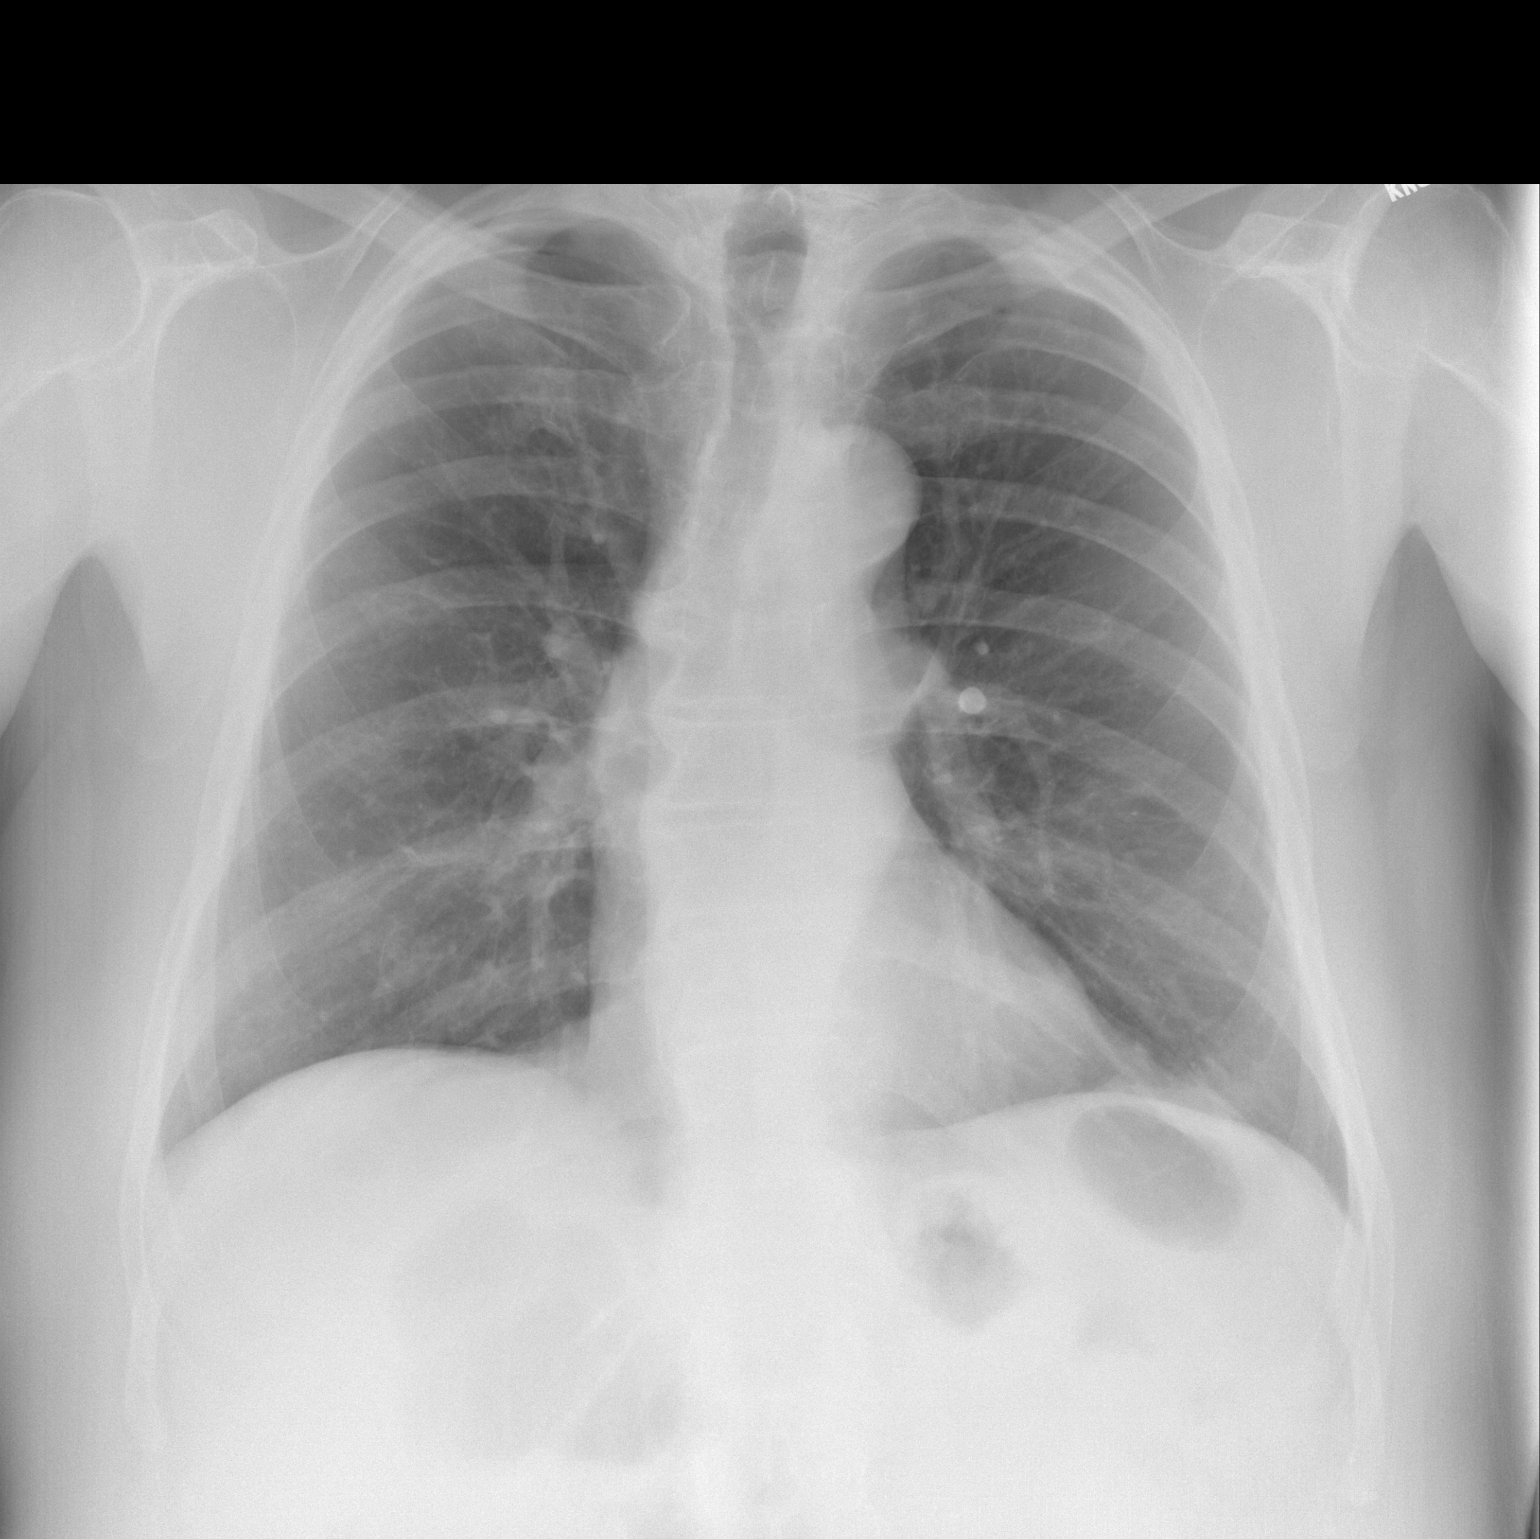

[w chest lat *]
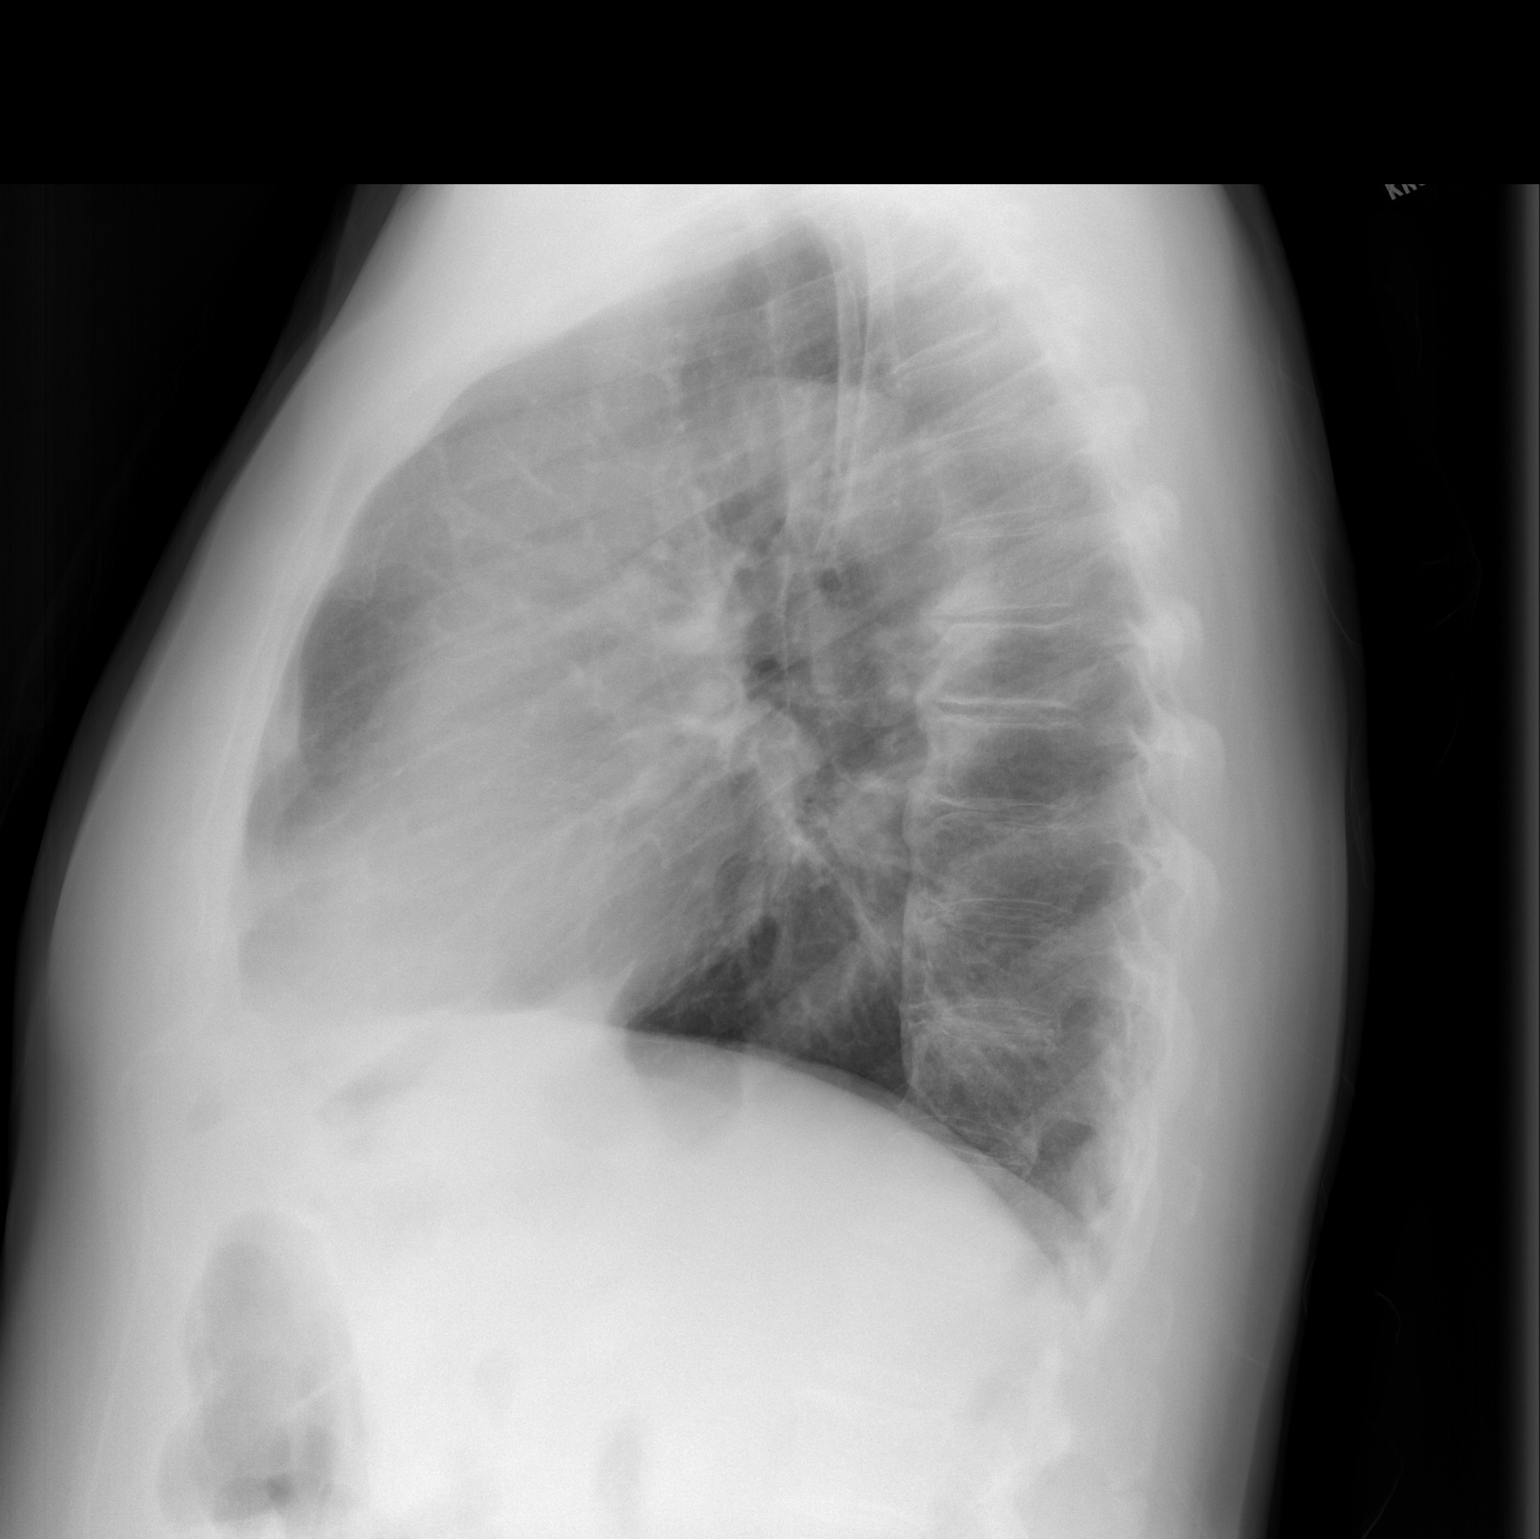

[2 of 2 positions shown; findings below may reference images not displayed]

FINDINGS: Mediastinum and hilar structures normal. Mild left base subsegmental
atelectasis and or infiltrate . Heart size normal. No pleural
effusion or pneumothorax.
IMPRESSION: Mild left base subsegmental atelectasis and or infiltrate.

## 2015-10-09 LAB — HEMOGLOBIN A1C
HEMOGLOBIN A1C: 6.4 % — AB (ref 4.8–5.6)
Mean Plasma Glucose: 137 mg/dL

## 2015-10-09 NOTE — Progress Notes (Signed)
Faxed chest xray results from 10-08-15 to dr borden pod and main fax number and left message with selita to make sure dr borden sees results

## 2015-10-10 NOTE — H&P (Signed)
History of Present Illness Jacob Nash is a 70 year old with the following urologic history:  1) Prostate cancer: He was noted to have an elevated PSA of 7.47 and a right apical prostate nodule (noted on a [facility] screening). He underwent a TRUS biopsy of the prostate on 08/14/15. This demonstrated bilateral apical hypoechoic lesions and his pathology confirmed Gleason 3+4=7 adenocarcinoma with 2 out of 12 biopsy cores positive for malignancy. He has no family history of prostate cancer.  His PMH is significant for diet-controlled diabetes, hypertension, hyperlipidemia, and arthritis. He also has a history of diabetes although did lose approximately 100 pounds and currently weighs about 220.  TNM stage: cT2a Nx Mx PSA: 7.47 Gleason score: 3+4=7 Biopsy (08/14/15): 2/12 cores -- L apex (5%, 3+3=6), R apex (30%, 3+4=7) Prostate volume: 119.9 cc  Nomogram OC disease: 48% EPE: 51% SVI: 3% LNI: 3% PFS (surgery): 84% at 5 years, 74% at 10 years  Urinary function: IPSS is 8. Erectile function: SHIM score is 3.    Past Medical History Problems  1. History of Anxiety (F41.9) 2. History of arthritis (Z87.39) 3. History of diabetes mellitus (Z86.39) 4. History of heartburn (Z87.898) 5. History of hyperlipidemia (Z86.39) 6. History of hypertension (Z86.79)  Surgical History Problems  1. History of Back Surgery 2. History of Shoulder Surgery Right  Current Meds 1. ALPRAZolam 0.5 MG Oral Tablet;  Therapy: (Recorded:15Mar2017) to Recorded 2. Aspirin 81 MG TABS;  Therapy: (Recorded:15Mar2017) to Recorded 3. Fluticasone Propionate 50 MCG/ACT Nasal Suspension;  Therapy: (Recorded:15Mar2017) to Recorded 4. HydroCHLOROthiazide 12.5 MG Oral Tablet;  Therapy: (Recorded:15Mar2017) to Recorded 5. LevoFLOXacin 500 MG Oral Tablet; 1 tablet the day before procedure, 1 tablet day of  procedure, and 1 tablet day after procedure;  Therapy: [id number] to (Last Rx:15Mar2017)  Requested  for: [id number] Ordered 6. Lisinopril 40 MG Oral Tablet;  Therapy: (Recorded:15Mar2017) to Recorded 7. Loratadine 10 MG Oral Tablet;  Therapy: (Recorded:15Mar2017) to Recorded 8. Multi Vitamin/Minerals Oral Tablet;  Therapy: (Recorded:15Mar2017) to Recorded 9. Omega-3 Fish Oil CAPS;  Therapy: (Recorded:15Mar2017) to Recorded 10. Omeprazole 20 MG Oral Capsule Delayed Release;   Therapy: (Recorded:15Mar2017) to Recorded 11. Simvastatin 10 MG Oral Tablet;   Therapy: (Recorded:15Mar2017) to Recorded 12. Sudafed Cold/Cough CAPS;   Therapy: (Recorded:15Mar2017) to Recorded 13. Vitamin D3 2000 UNIT Oral Tablet;   Therapy: (Recorded:15Mar2017) to Recorded  Allergies Medication  1. No Known Drug Allergies  Family History Problems  1. Family history of Death of family member : Mother, Father   Mother at age 38; breast cancerFather at age 68; rare blood disease 2. Family history of blood dyscrasia (Z83.2) : Father 3. Family history of malignant neoplasm of breast (Z80.3) : Mother  Social History Problems  1. Denied: History of Alcohol use 2. Married 3. Never a smoker 4. Number of children   1 son and 1 daughter 5. Retired  Review of Systems  Genitourinary: no hematuria.     Physical Exam Constitutional: Well nourished and well developed . No acute distress.  ENT:. The ears and nose are normal in appearance.  Neck: The appearance of the neck is normal and no neck mass is present.  Pulmonary: No respiratory distress, normal respiratory rhythm and effort and clear bilateral breath sounds.  Cardiovascular: Heart rate and rhythm are normal . No peripheral edema.  Abdomen: The abdomen is obese. The abdomen is soft and nontender. No masses are palpated. No CVA tenderness. No hernias are palpable. No hepatosplenomegaly noted.  Lymphatics: The femoral and  inguinal nodes are not enlarged or tender.  Skin: Normal skin turgor, no visible rash and no visible skin lesions.  Neuro/Psych:.  Mood and affect are appropriate.      Assessment Assessed  1. Prostate cancer (C61)   Discussion/Summary 1. Intermediate risk prostate cancer: He has elected to undergo surgical therapy.  He will undergo a robot-assisted laparoscopic radical prostatectomy and pelvic lymphadenectomy.

## 2015-10-11 ENCOUNTER — Inpatient Hospital Stay (HOSPITAL_COMMUNITY): Payer: Medicare Other | Admitting: Anesthesiology

## 2015-10-11 ENCOUNTER — Encounter (HOSPITAL_COMMUNITY)
Admission: RE | Disposition: A | Discharge status: Home / Self Care | Payer: Self-pay | Source: Ambulatory Visit | Attending: Urology

## 2015-10-11 ENCOUNTER — Encounter (HOSPITAL_COMMUNITY): Payer: Self-pay

## 2015-10-11 ENCOUNTER — Inpatient Hospital Stay (HOSPITAL_COMMUNITY)
Admission: RE | Admit: 2015-10-11 | Discharge: 2015-10-12 | DRG: 708 | Disposition: A | Discharge status: Home / Self Care | Payer: Medicare Other | Source: Ambulatory Visit | Attending: Urology | Admitting: Urology

## 2015-10-11 DIAGNOSIS — Z79899 Other long term (current) drug therapy: Secondary | ICD-10-CM

## 2015-10-11 DIAGNOSIS — E119 Type 2 diabetes mellitus without complications: Secondary | ICD-10-CM | POA: Diagnosis present

## 2015-10-11 DIAGNOSIS — I10 Essential (primary) hypertension: Secondary | ICD-10-CM | POA: Diagnosis present

## 2015-10-11 DIAGNOSIS — E785 Hyperlipidemia, unspecified: Secondary | ICD-10-CM | POA: Diagnosis not present

## 2015-10-11 DIAGNOSIS — K219 Gastro-esophageal reflux disease without esophagitis: Secondary | ICD-10-CM | POA: Diagnosis not present

## 2015-10-11 DIAGNOSIS — M1990 Unspecified osteoarthritis, unspecified site: Secondary | ICD-10-CM | POA: Diagnosis present

## 2015-10-11 DIAGNOSIS — C61 Malignant neoplasm of prostate: Principal | ICD-10-CM | POA: Diagnosis present

## 2015-10-11 DIAGNOSIS — Z7982 Long term (current) use of aspirin: Secondary | ICD-10-CM | POA: Diagnosis not present

## 2015-10-11 HISTORY — PX: ROBOT ASSISTED LAPAROSCOPIC RADICAL PROSTATECTOMY: SHX5141

## 2015-10-11 HISTORY — PX: LYMPHADENECTOMY: SHX5960

## 2015-10-11 LAB — TYPE AND SCREEN
ABO/RH(D): A POS
ANTIBODY SCREEN: NEGATIVE

## 2015-10-11 LAB — GLUCOSE, CAPILLARY
Glucose-Capillary: 103 mg/dL — ABNORMAL HIGH (ref 65–99)
Glucose-Capillary: 156 mg/dL — ABNORMAL HIGH (ref 65–99)
Glucose-Capillary: 214 mg/dL — ABNORMAL HIGH (ref 65–99)
Glucose-Capillary: 209 mg/dL — ABNORMAL HIGH (ref 65–99)

## 2015-10-11 LAB — HEMOGLOBIN AND HEMATOCRIT, BLOOD
HEMATOCRIT: 38.2 % — AB (ref 39.0–52.0)
HEMOGLOBIN: 13 g/dL (ref 13.0–17.0)

## 2015-10-11 SURGERY — XI ROBOTIC ASSISTED LAPAROSCOPIC RADICAL PROSTATECTOMY LEVEL 2
Anesthesia: General

## 2015-10-11 MED ORDER — FLEET ENEMA 7-19 GM/118ML RE ENEM: 1.0000 | ENEMA | Freq: Once | RECTAL | Status: DC

## 2015-10-11 MED ORDER — LORATADINE 10 MG PO TABS
10.0000 mg | ORAL_TABLET | Freq: Every day | ORAL | Status: DC
  Administered 2015-10-12: 10 mg via ORAL
  Filled 2015-10-11: qty 1

## 2015-10-11 MED ORDER — SIMVASTATIN 10 MG PO TABS
10.0000 mg | ORAL_TABLET | Freq: Every evening | ORAL | Status: DC
  Administered 2015-10-11: 10 mg via ORAL
  Filled 2015-10-11: qty 1

## 2015-10-11 MED ORDER — FENTANYL CITRATE (PF) 100 MCG/2ML IJ SOLN
INTRAMUSCULAR | Status: DC | PRN
  Administered 2015-10-11 (×3): 50 ug via INTRAVENOUS
  Administered 2015-10-11: 100 ug via INTRAVENOUS

## 2015-10-11 MED ORDER — LIDOCAINE HCL (CARDIAC) 20 MG/ML IV SOLN
INTRAVENOUS | Status: DC | PRN
  Administered 2015-10-11: 50 mg via INTRAVENOUS

## 2015-10-11 MED ORDER — FLUTICASONE PROPIONATE 50 MCG/ACT NA SUSP
2.0000 | Freq: Every day | NASAL | Status: DC | PRN
  Filled 2015-10-11: qty 16

## 2015-10-11 MED ORDER — PROCHLORPERAZINE EDISYLATE 5 MG/ML IJ SOLN: 10.0000 mg | Freq: Once | INTRAMUSCULAR | Status: DC | PRN

## 2015-10-11 MED ORDER — HYDROCODONE-ACETAMINOPHEN 5-325 MG PO TABS: 1.0000 | ORAL_TABLET | Freq: Four times a day (QID) | ORAL | Status: DC | PRN

## 2015-10-11 MED ORDER — HYDROMORPHONE HCL 1 MG/ML IJ SOLN
0.2500 mg | INTRAMUSCULAR | Status: DC | PRN
  Administered 2015-10-11 (×2): 0.5 mg via INTRAVENOUS

## 2015-10-11 MED ORDER — LACTATED RINGERS IV SOLN
INTRAVENOUS | Status: DC | PRN
  Administered 2015-10-11: 07:00:00 via INTRAVENOUS

## 2015-10-11 MED ORDER — SUCCINYLCHOLINE CHLORIDE 20 MG/ML IJ SOLN
INTRAMUSCULAR | Status: DC | PRN
  Administered 2015-10-11: 100 mg via INTRAVENOUS

## 2015-10-11 MED ORDER — MIDAZOLAM HCL 5 MG/5ML IJ SOLN
INTRAMUSCULAR | Status: DC | PRN
  Administered 2015-10-11: 2 mg via INTRAVENOUS

## 2015-10-11 MED ORDER — CEFAZOLIN SODIUM 1-5 GM-% IV SOLN
1.0000 g | Freq: Three times a day (TID) | INTRAVENOUS | Status: AC
  Administered 2015-10-11 (×2): 1 g via INTRAVENOUS
  Filled 2015-10-11 (×2): qty 50

## 2015-10-11 MED ORDER — DEXAMETHASONE SODIUM PHOSPHATE 10 MG/ML IJ SOLN
INTRAMUSCULAR | Status: AC
  Filled 2015-10-11: qty 1

## 2015-10-11 MED ORDER — ONDANSETRON HCL 4 MG/2ML IJ SOLN
INTRAMUSCULAR | Status: DC | PRN
  Administered 2015-10-11: 4 mg via INTRAVENOUS

## 2015-10-11 MED ORDER — KETOROLAC TROMETHAMINE 15 MG/ML IJ SOLN
15.0000 mg | Freq: Four times a day (QID) | INTRAMUSCULAR | Status: DC
Start: 2015-10-11 — End: 2015-10-12
  Administered 2015-10-11 – 2015-10-12 (×4): 15 mg via INTRAVENOUS
  Filled 2015-10-11 (×5): qty 1

## 2015-10-11 MED ORDER — HEPARIN SODIUM (PORCINE) 1000 UNIT/ML IJ SOLN
INTRAMUSCULAR | Status: AC
  Filled 2015-10-11: qty 1

## 2015-10-11 MED ORDER — DEXAMETHASONE SODIUM PHOSPHATE 10 MG/ML IJ SOLN
INTRAMUSCULAR | Status: DC | PRN
  Administered 2015-10-11: 10 mg via INTRAVENOUS

## 2015-10-11 MED ORDER — SUGAMMADEX SODIUM 200 MG/2ML IV SOLN
INTRAVENOUS | Status: AC
  Filled 2015-10-11: qty 2

## 2015-10-11 MED ORDER — MIDAZOLAM HCL 2 MG/2ML IJ SOLN
INTRAMUSCULAR | Status: AC
  Filled 2015-10-11: qty 2

## 2015-10-11 MED ORDER — ROCURONIUM BROMIDE 100 MG/10ML IV SOLN
INTRAVENOUS | Status: DC | PRN
  Administered 2015-10-11 (×2): 10 mg via INTRAVENOUS
  Administered 2015-10-11: 45 mg via INTRAVENOUS
  Administered 2015-10-11: 5 mg via INTRAVENOUS

## 2015-10-11 MED ORDER — MORPHINE SULFATE (PF) 10 MG/ML IV SOLN: 2.0000 mg | INTRAVENOUS | Status: DC | PRN

## 2015-10-11 MED ORDER — PROPOFOL 10 MG/ML IV BOLUS
INTRAVENOUS | Status: DC | PRN
  Administered 2015-10-11: 180 mg via INTRAVENOUS

## 2015-10-11 MED ORDER — LACTATED RINGERS IV SOLN: INTRAVENOUS | Status: DC

## 2015-10-11 MED ORDER — MAGNESIUM CITRATE PO SOLN
1.0000 | Freq: Once | ORAL | Status: DC
  Filled 2015-10-11: qty 296

## 2015-10-11 MED ORDER — KCL IN DEXTROSE-NACL 20-5-0.45 MEQ/L-%-% IV SOLN
INTRAVENOUS | Status: DC
  Administered 2015-10-11 – 2015-10-12 (×4): via INTRAVENOUS
  Filled 2015-10-11 (×6): qty 1000

## 2015-10-11 MED ORDER — PANTOPRAZOLE SODIUM 40 MG PO TBEC
40.0000 mg | DELAYED_RELEASE_TABLET | Freq: Every day | ORAL | Status: DC
Start: 2015-10-12 — End: 2015-10-12
  Administered 2015-10-12: 40 mg via ORAL
  Filled 2015-10-11: qty 1

## 2015-10-11 MED ORDER — ONDANSETRON HCL 4 MG/2ML IJ SOLN
4.0000 mg | INTRAMUSCULAR | Status: DC | PRN
  Administered 2015-10-11: 4 mg via INTRAVENOUS
  Filled 2015-10-11: qty 2

## 2015-10-11 MED ORDER — HYDROCHLOROTHIAZIDE 12.5 MG PO CAPS
12.5000 mg | ORAL_CAPSULE | Freq: Every day | ORAL | Status: DC
  Administered 2015-10-11 – 2015-10-12 (×2): 12.5 mg via ORAL
  Filled 2015-10-11 (×2): qty 1

## 2015-10-11 MED ORDER — SULFAMETHOXAZOLE-TRIMETHOPRIM 800-160 MG PO TABS: 1.0000 | ORAL_TABLET | Freq: Two times a day (BID) | ORAL | Status: DC

## 2015-10-11 MED ORDER — DOCUSATE SODIUM 100 MG PO CAPS
100.0000 mg | ORAL_CAPSULE | Freq: Two times a day (BID) | ORAL | Status: DC
  Administered 2015-10-11 – 2015-10-12 (×3): 100 mg via ORAL
  Filled 2015-10-11 (×3): qty 1

## 2015-10-11 MED ORDER — DIPHENHYDRAMINE HCL 12.5 MG/5ML PO ELIX: 12.5000 mg | ORAL_SOLUTION | Freq: Four times a day (QID) | ORAL | Status: DC | PRN

## 2015-10-11 MED ORDER — LIDOCAINE HCL (CARDIAC) 20 MG/ML IV SOLN
INTRAVENOUS | Status: AC
  Filled 2015-10-11: qty 5

## 2015-10-11 MED ORDER — HYDROMORPHONE HCL 2 MG/ML IJ SOLN
INTRAMUSCULAR | Status: AC
  Filled 2015-10-11: qty 1

## 2015-10-11 MED ORDER — ALPRAZOLAM 0.5 MG PO TABS
0.5000 mg | ORAL_TABLET | Freq: Two times a day (BID) | ORAL | Status: DC
  Administered 2015-10-11 – 2015-10-12 (×2): 0.5 mg via ORAL
  Filled 2015-10-11 (×2): qty 1

## 2015-10-11 MED ORDER — LACTATED RINGERS IV SOLN
INTRAVENOUS | Status: DC | PRN
  Administered 2015-10-11: 08:00:00

## 2015-10-11 MED ORDER — HYDROMORPHONE HCL 1 MG/ML IJ SOLN
INTRAMUSCULAR | Status: DC | PRN
  Administered 2015-10-11 (×2): 1 mg via INTRAVENOUS

## 2015-10-11 MED ORDER — ROCURONIUM BROMIDE 100 MG/10ML IV SOLN
INTRAVENOUS | Status: AC
  Filled 2015-10-11: qty 1

## 2015-10-11 MED ORDER — SODIUM CHLORIDE 0.9 % IV BOLUS (SEPSIS)
1000.0000 mL | Freq: Once | INTRAVENOUS | Status: AC
  Administered 2015-10-11: 1000 mL via INTRAVENOUS

## 2015-10-11 MED ORDER — FENTANYL CITRATE (PF) 250 MCG/5ML IJ SOLN
INTRAMUSCULAR | Status: AC
  Filled 2015-10-11: qty 5

## 2015-10-11 MED ORDER — CEFAZOLIN SODIUM-DEXTROSE 2-4 GM/100ML-% IV SOLN
2.0000 g | INTRAVENOUS | Status: AC
  Administered 2015-10-11: 2 g via INTRAVENOUS
  Filled 2015-10-11: qty 100

## 2015-10-11 MED ORDER — BUPIVACAINE-EPINEPHRINE 0.25% -1:200000 IJ SOLN
INTRAMUSCULAR | Status: DC | PRN
  Administered 2015-10-11: 30 mL

## 2015-10-11 MED ORDER — INSULIN ASPART 100 UNIT/ML ~~LOC~~ SOLN
0.0000 [IU] | SUBCUTANEOUS | Status: DC
  Administered 2015-10-11 (×2): 5 [IU] via SUBCUTANEOUS
  Administered 2015-10-11: 3 [IU] via SUBCUTANEOUS
  Administered 2015-10-12: 2 [IU] via SUBCUTANEOUS
  Administered 2015-10-12 (×2): 3 [IU] via SUBCUTANEOUS

## 2015-10-11 MED ORDER — SODIUM CHLORIDE 0.9 % IR SOLN
Status: DC | PRN
  Administered 2015-10-11: 1000 mL

## 2015-10-11 MED ORDER — SUGAMMADEX SODIUM 200 MG/2ML IV SOLN
INTRAVENOUS | Status: DC | PRN
  Administered 2015-10-11: 200 mg via INTRAVENOUS

## 2015-10-11 MED ORDER — CEFAZOLIN SODIUM-DEXTROSE 2-4 GM/100ML-% IV SOLN
INTRAVENOUS | Status: AC
  Filled 2015-10-11: qty 100

## 2015-10-11 MED ORDER — BUPIVACAINE-EPINEPHRINE (PF) 0.25% -1:200000 IJ SOLN
INTRAMUSCULAR | Status: AC
  Filled 2015-10-11: qty 30

## 2015-10-11 MED ORDER — ONDANSETRON HCL 4 MG/2ML IJ SOLN
INTRAMUSCULAR | Status: AC
  Filled 2015-10-11: qty 2

## 2015-10-11 MED ORDER — DIPHENHYDRAMINE HCL 50 MG/ML IJ SOLN: 12.5000 mg | Freq: Four times a day (QID) | INTRAMUSCULAR | Status: DC | PRN

## 2015-10-11 MED ORDER — HYDROMORPHONE HCL 1 MG/ML IJ SOLN
INTRAMUSCULAR | Status: AC
  Filled 2015-10-11: qty 1

## 2015-10-11 MED ORDER — PROPOFOL 10 MG/ML IV BOLUS
INTRAVENOUS | Status: AC
  Filled 2015-10-11: qty 20

## 2015-10-11 MED ORDER — MORPHINE SULFATE (PF) 2 MG/ML IV SOLN: 2.0000 mg | INTRAVENOUS | Status: DC | PRN

## 2015-10-11 MED ORDER — ACETAMINOPHEN 325 MG PO TABS
650.0000 mg | ORAL_TABLET | ORAL | Status: DC | PRN
  Administered 2015-10-11: 650 mg via ORAL
  Filled 2015-10-11: qty 2

## 2015-10-11 SURGICAL SUPPLY — 53 items
APPLICATOR COTTON TIP 6IN STRL (MISCELLANEOUS) ×3 IMPLANT
BAG SPEC RTRVL LRG 6X4 10 (ENDOMECHANICALS) ×2
CATH FOLEY 2WAY SLVR 18FR 30CC (CATHETERS) ×3 IMPLANT
CATH ROBINSON RED A/P 16FR (CATHETERS) ×3 IMPLANT
CATH ROBINSON RED A/P 8FR (CATHETERS) ×3 IMPLANT
CATH TIEMANN FOLEY 18FR 5CC (CATHETERS) ×3 IMPLANT
CHLORAPREP W/TINT 26ML (MISCELLANEOUS) ×3 IMPLANT
CLIP LIGATING HEM O LOK PURPLE (MISCELLANEOUS) ×7 IMPLANT
COVER SURGICAL LIGHT HANDLE (MISCELLANEOUS) ×4 IMPLANT
COVER TIP SHEARS 8 DVNC (MISCELLANEOUS) ×2 IMPLANT
COVER TIP SHEARS 8MM DA VINCI (MISCELLANEOUS) ×1
CUTTER ECHEON FLEX ENDO 45 340 (ENDOMECHANICALS) ×3 IMPLANT
DECANTER SPIKE VIAL GLASS SM (MISCELLANEOUS) ×2 IMPLANT
DRAPE ARM DVNC X/XI (DISPOSABLE) ×8 IMPLANT
DRAPE COLUMN DVNC XI (DISPOSABLE) ×2 IMPLANT
DRAPE DA VINCI XI ARM (DISPOSABLE) ×4
DRAPE DA VINCI XI COLUMN (DISPOSABLE) ×1
DRAPE SURG IRRIG POUCH 19X23 (DRAPES) ×3 IMPLANT
DRSG TEGADERM 4X4.75 (GAUZE/BANDAGES/DRESSINGS) ×3 IMPLANT
ELECT REM PT RETURN 9FT ADLT (ELECTROSURGICAL) ×3
ELECTRODE REM PT RTRN 9FT ADLT (ELECTROSURGICAL) ×2 IMPLANT
GLOVE BIO SURGEON STRL SZ 6.5 (GLOVE) ×3 IMPLANT
GLOVE BIOGEL M STRL SZ7.5 (GLOVE) ×6 IMPLANT
GOWN STRL REUS W/TWL LRG LVL3 (GOWN DISPOSABLE) ×9 IMPLANT
HEMOSTAT SURGICEL 4X8 (HEMOSTASIS) ×1 IMPLANT
HOLDER FOLEY CATH W/STRAP (MISCELLANEOUS) ×3 IMPLANT
IV LACTATED RINGERS 1000ML (IV SOLUTION) ×2 IMPLANT
LIQUID BAND (GAUZE/BANDAGES/DRESSINGS) IMPLANT
NDL SAFETY ECLIPSE 18X1.5 (NEEDLE) ×2 IMPLANT
NEEDLE HYPO 18GX1.5 SHARP (NEEDLE) ×3
PACK ROBOT UROLOGY CUSTOM (CUSTOM PROCEDURE TRAY) ×3 IMPLANT
POUCH SPECIMEN RETRIEVAL 10MM (ENDOMECHANICALS) ×1 IMPLANT
RELOAD GREEN ECHELON 45 (STAPLE) ×3 IMPLANT
SEAL CANN UNIV 5-8 DVNC XI (MISCELLANEOUS) ×8 IMPLANT
SEAL XI 5MM-8MM UNIVERSAL (MISCELLANEOUS) ×4
SET TUBE IRRIG SUCTION NO TIP (IRRIGATION / IRRIGATOR) ×3 IMPLANT
SOLUTION ELECTROLUBE (MISCELLANEOUS) ×3 IMPLANT
SUT ETHILON 3 0 PS 1 (SUTURE) ×3 IMPLANT
SUT MNCRL 3 0 RB1 (SUTURE) ×2 IMPLANT
SUT MNCRL 3 0 VIOLET RB1 (SUTURE) ×2 IMPLANT
SUT MNCRL AB 4-0 PS2 18 (SUTURE) ×6 IMPLANT
SUT MONOCRYL 3 0 RB1 (SUTURE) ×2
SUT VIC AB 0 CT1 27 (SUTURE) ×3
SUT VIC AB 0 CT1 27XBRD ANTBC (SUTURE) ×2 IMPLANT
SUT VIC AB 0 UR5 27 (SUTURE) ×3 IMPLANT
SUT VIC AB 2-0 SH 27 (SUTURE) ×3
SUT VIC AB 2-0 SH 27X BRD (SUTURE) ×2 IMPLANT
SUT VICRYL 0 UR6 27IN ABS (SUTURE) ×6 IMPLANT
SYR 27GX1/2 1ML LL SAFETY (SYRINGE) ×3 IMPLANT
TOWEL OR 17X26 10 PK STRL BLUE (TOWEL DISPOSABLE) ×3 IMPLANT
TOWEL OR NON WOVEN STRL DISP B (DISPOSABLE) ×3 IMPLANT
TUBING INSUFFLATION 10FT LAP (TUBING) IMPLANT
WATER STERILE IRR 1500ML POUR (IV SOLUTION) ×4 IMPLANT

## 2015-10-11 NOTE — Op Note (Signed)
Preoperative diagnosis: Clinically localized adenocarcinoma of the prostate (clinical stage T2a Nx Mx)  Postoperative diagnosis: Clinically localized adenocarcinoma of the prostate (clinical stage T2a Nx Mx)  Procedure:  1. Robotic assisted laparoscopic radical prostatectomy (left nerve sparing) 2. Bilateral robotic assisted laparoscopic pelvic lymphadenectomy  Surgeon: Pryor Curia. M.D.  Assistant(s):  Debbrah Alar, PA-C  Anesthesia: General  Complications: None  EBL: 50 mL  IVF:  1700 mL crystalloid  Specimens: 1. Prostate and seminal vesicles 2. Right pelvic lymph nodes 3. Left pelvic lymph nodes  Disposition of specimens: Pathology  Drains: 1. 20 Fr coude catheter 2. # 19 Blake pelvic drain  Indication: Jacob Nash is a 70 y.o. patient with clinically localized prostate cancer.  After a thorough review of the management options for treatment of prostate cancer, he elected to proceed with surgical therapy and the above procedure(s).  We have discussed the potential benefits and risks of the procedure, side effects of the proposed treatment, the likelihood of the patient achieving the goals of the procedure, and any potential problems that might occur during the procedure or recuperation. Informed consent has been obtained.  Description of procedure:  The patient was taken to the operating room and a general anesthetic was administered. He was given preoperative antibiotics, placed in the dorsal lithotomy position, and prepped and draped in the usual sterile fashion. Next a preoperative timeout was performed. A urethral catheter was placed into the bladder and a site was selected near the umbilicus for placement of the camera port. This was placed using a standard open Hassan technique which allowed entry into the peritoneal cavity under direct vision and without difficulty. A 12 mm port was placed and a pneumoperitoneum established. The camera was then used to  inspect the abdomen and there was no evidence of any intra-abdominal injuries or other abnormalities. The remaining abdominal ports were then placed. 8 mm robotic ports were placed in the right lower quadrant, left lower quadrant, and far left lateral abdominal wall. A 5 mm port was placed in the right upper quadrant and a 12 mm port was placed in the right lateral abdominal wall for laparoscopic assistance. All ports were placed under direct vision without difficulty. The surgical cart was then docked.   Utilizing the cautery scissors, the bladder was reflected posteriorly allowing entry into the space of Retzius and identification of the endopelvic fascia and prostate. The periprostatic fat was then removed from the prostate allowing full exposure of the endopelvic fascia. The endopelvic fascia was then incised from the apex back to the base of the prostate bilaterally and the underlying levator muscle fibers were swept laterally off the prostate thereby isolating the dorsal venous complex. The dorsal vein was then stapled and divided with a 45 mm Flex Echelon stapler. Attention then turned to the bladder neck which was divided anteriorly thereby allowing entry into the bladder and exposure of the urethral catheter. The catheter balloon was deflated and the catheter was brought into the operative field and used to retract the prostate anteriorly. The posterior bladder neck was then examined and was divided allowing further dissection between the bladder and prostate posteriorly until the vasa deferentia and seminal vessels were identified. The vasa deferentia were isolated, divided, and lifted anteriorly. The seminal vesicles were dissected down to their tips with care to control the seminal vascular arterial blood supply. These structures were then lifted anteriorly and the space between Denonvillier's fascia and the anterior rectum was developed with a combination of  sharp and blunt dissection. This isolated  the vascular pedicles of the prostate.  The lateral prostatic fascia on the left side of the prostate was then sharply incised allowing release of the neurovascular bundle. The vascular pedicle of the prostate on the left side was then ligated with Weck clips between the prostate and neurovascular bundle and divided with sharp cold scissor dissection resulting in neurovascular bundle preservation. On the right side, a wide non nerve sparing dissection was performed with Weck clips used to ligate the vascular pedicle of the prostate. The neurovascular bundle on the left side was then separated off the apex of the prostate and urethra.   The urethra was then sharply transected allowing the prostate specimen to be disarticulated. The pelvis was copiously irrigated and hemostasis was ensured. There was no evidence for rectal injury.  Attention then turned to the right pelvic sidewall. The fibrofatty tissue between the external iliac vein, confluence of the iliac vessels, hypogastric artery, and Cooper's ligament was dissected free from the pelvic sidewall with care to preserve the obturator nerve. Weck clips were used for lymphostasis and hemostasis. An identical procedure was performed on the contralateral side and the lymphatic packets were removed for permanent pathologic analysis.  Attention then turned to the urethral anastomosis. A 2-0 Vicryl slip knot was placed between Denonvillier's fascia, the posterior bladder neck, and the posterior urethra to reapproximate these structures. A double-armed 3-0 Monocryl suture was then used to perform a 360 running tension-free anastomosis between the bladder neck and urethra. A new urethral catheter was then placed into the bladder and irrigated. There were no blood clots within the bladder and the anastomosis appeared to be watertight. An 8 x 4 cm piece of surgicel was placed at the distal aspect of the left neurovascular bundle for hemostasis.  A #19 Blake drain  was then brought through the left lateral 8 mm port site and positioned appropriately within the pelvis. It was secured to the skin with a nylon suture. The surgical cart was then undocked. The right lateral 12 mm port site was closed at the fascial level with a 0 Vicryl suture placed laparoscopically. All remaining ports were then removed under direct vision. The prostate specimen was removed intact within the Endopouch retrieval bag via the periumbilical camera port site. This fascial opening was closed with two running 0 Vicryl sutures. 0.25% Marcaine was then injected into all port sites and all incisions were reapproximated at the skin level with 4-0 Monocryl subcuticular sutures and Dermabond. The patient appeared to tolerate the procedure well and without complications. The patient was able to be extubated and transferred to the recovery unit in satisfactory condition.   Pryor Curia MD

## 2015-10-11 NOTE — Anesthesia Postprocedure Evaluation (Signed)
Anesthesia Post Note  Patient: Jacob Nash  Procedure(s) Performed: Procedure(s) (LRB): XI ROBOTIC ASSISTED LAPAROSCOPIC RADICAL PROSTATECTOMY LEVEL 2 (N/A) BILATERAL PELVIC LYMPHADENECTOMY (Bilateral)  Patient location during evaluation: PACU Anesthesia Type: General Level of consciousness: awake and alert Pain management: pain level controlled Vital Signs Assessment: post-procedure vital signs reviewed and stable Respiratory status: spontaneous breathing, nonlabored ventilation, respiratory function stable and patient connected to nasal cannula oxygen Cardiovascular status: blood pressure returned to baseline and stable Postop Assessment: no signs of nausea or vomiting Anesthetic complications: no    Last Vitals:  Filed Vitals:   10/11/15 1124 10/11/15 1428  BP: 152/81 133/87  Pulse:  75  Temp: 36.4 C 36.6 C  Resp: 16 17    Last Pain:  Filed Vitals:   10/11/15 1449  PainSc: Asleep                 Cheryle Dark J

## 2015-10-11 NOTE — Discharge Instructions (Signed)
°  1. Activity:  You are encouraged to ambulate frequently (about every hour during waking hours) to help prevent blood clots from forming in your legs or lungs.  However, you should not engage in any heavy lifting (> 10-15 lbs), strenuous activity, or straining. °2. Diet: You should continue a clear liquid diet until passing gas from below.  Once this occurs, you may advance your diet to a soft diet that would be easy to digest (i.e soups, scrambled eggs, mashed potatoes, etc.) for 24 hours just as you would if getting over a bad stomach flu.  If tolerating this diet well for 24 hours, you may then begin eating regular food.  It will be normal to have some amount of bloating, nausea, and abdominal discomfort intermittently. °3. Prescriptions:  You will be provided a prescription for pain medication to take as needed.  If your pain is not severe enough to require the prescription pain medication, you may take Tylenol instead.  You should also take an over the counter stool softener (Colace 100 mg twice daily) to avoid straining with bowel movements as the pain medication may constipate you. Finally, you will also be provided a prescription for an antibiotic to begin the day prior to your return visit in the office for catheter removal. °4. Catheter care: You will be taught how to take care of the catheter by the nursing staff prior to discharge from the hospital.  You may use both a leg bag and the larger bedside bag but it is recommended to at least use the bigger bedside bag at nighttime as the leg bag is small and will fill up overnight and also does not drain as well when lying flat. You may periodically feel a strong urge to void with the catheter in place.  This is a bladder spasm and most often can occur when having a bowel movement or when you are moving around. It is typically self-limited and usually will stop after a few minutes.  You may use some Vaseline or Neosporin around the tip of the catheter to  reduce friction at the tip of the penis. °5. Incisions: You may remove your dressing bandages the 2nd day after surgery.  You most likely will have a few small staples in each of the incisions and once the bandages are removed, the incisions may stay open to air.  You may start showering (not soaking or bathing in water) 48 hours after surgery and the incisions simply need to be patted dry after the shower.  No additional care is needed. °6. What to call us about: You should call the office ([phone]) if you develop fever > 101, persistent vomiting, or the catheter stops draining. Also, feel free to call with any other questions you may have and remember the handout that was provided to you as a reference preoperatively which answers many of the common questions that arise after surgery. °7. You may resume mobic, aspirin, advil, aleve, vitamins, and supplements 7 days after surgery. °

## 2015-10-11 NOTE — Anesthesia Procedure Notes (Signed)
Procedure Name: Intubation Date/Time: 10/11/2015 7:19 AM Performed by: Noralyn Pick D Pre-anesthesia Checklist: Patient identified, Emergency Drugs available, Suction available and Patient being monitored Patient Re-evaluated:Patient Re-evaluated prior to inductionOxygen Delivery Method: Circle System Utilized Preoxygenation: Pre-oxygenation with 100% oxygen Intubation Type: IV induction Ventilation: Mask ventilation without difficulty Laryngoscope Size: Mac and 4 Grade View: Grade I Tube type: Oral Tube size: 7.5 mm Number of attempts: 1 Airway Equipment and Method: Stylet and Oral airway Placement Confirmation: ETT inserted through vocal cords under direct vision,  positive ETCO2 and breath sounds checked- equal and bilateral Secured at: 22 cm Tube secured with: Tape Dental Injury: Teeth and Oropharynx as per pre-operative assessment

## 2015-10-11 NOTE — Anesthesia Preprocedure Evaluation (Addendum)
Anesthesia Evaluation  Patient identified by MRN, date of birth, ID band Patient awake    Reviewed: Allergy & Precautions, NPO status , Patient's Chart, lab work & pertinent test results  History of Anesthesia Complications (+) PONV and history of anesthetic complications  Airway Mallampati: II  TM Distance: >3 FB Neck ROM: Full    Dental no notable dental hx.    Pulmonary neg pulmonary ROS,    Pulmonary exam normal breath sounds clear to auscultation       Cardiovascular hypertension, Pt. on medications negative cardio ROS Normal cardiovascular exam Rhythm:Regular Rate:Normal     Neuro/Psych negative neurological ROS  negative psych ROS   GI/Hepatic Neg liver ROS, GERD  Medicated,  Endo/Other  diabetes, Type 2  Renal/GU negative Renal ROS  negative genitourinary   Musculoskeletal  (+) Arthritis ,   Abdominal   Peds negative pediatric ROS (+)  Hematology negative hematology ROS (+)   Anesthesia Other Findings   Reproductive/Obstetrics negative OB ROS                             Anesthesia Physical Anesthesia Plan  ASA: II  Anesthesia Plan: General   Post-op Pain Management:    Induction: Intravenous  Airway Management Planned: Oral ETT  Additional Equipment:   Intra-op Plan:   Post-operative Plan: Extubation in OR  Informed Consent: I have reviewed the patients History and Physical, chart, labs and discussed the procedure including the risks, benefits and alternatives for the proposed anesthesia with the patient or authorized representative who has indicated his/her understanding and acceptance.   Dental advisory given  Plan Discussed with: CRNA  Anesthesia Plan Comments:         Anesthesia Quick Evaluation

## 2015-10-11 NOTE — Transfer of Care (Signed)
Immediate Anesthesia Transfer of Care Note  Patient: Jacob Nash  Procedure(s) Performed: Procedure(s): XI ROBOTIC ASSISTED LAPAROSCOPIC RADICAL PROSTATECTOMY LEVEL 2 (N/A) BILATERAL PELVIC LYMPHADENECTOMY (Bilateral)  Patient Location: PACU  Anesthesia Type:General  Level of Consciousness: awake, alert  and oriented  Airway & Oxygen Therapy: Patient Spontanous Breathing and Patient connected to face mask oxygen  Post-op Assessment: Report given to RN and Post -op Vital signs reviewed and stable  Post vital signs: Reviewed and stable  Last Vitals:  Filed Vitals:   10/11/15 0505  BP: 125/89  Pulse: 85  Temp: 36.9 C  Resp: 16    Last Pain: There were no vitals filed for this visit.       Complications: No apparent anesthesia complications

## 2015-10-11 NOTE — Progress Notes (Signed)
Utilization review completed.  

## 2015-10-11 NOTE — Progress Notes (Signed)
Post-op note  Subjective: The patient is doing well.  No complaints.  Had mild nausea earlier but has resolved.  Objective: Vital signs in last 24 hours: Temp:  [97.6 F (36.4 C)-98.5 F (36.9 C)] 97.6 F (36.4 C) (05/25 1124) Pulse Rate:  [66-85] 66 (05/25 1104) Resp:  [11-21] 16 (05/25 1124) BP: (118-152)/(73-89) 152/81 mmHg (05/25 1124) SpO2:  [97 %-100 %] 98 % (05/25 1124)  Intake/Output from previous day:   Intake/Output this shift: Total I/O In: 1900 [I.V.:1900] Out: 120 [Drains:70; Blood:50]  Physical Exam:  General: Alert and oriented. Abdomen: Soft, Nondistended. Incisions: Clean and dry. Urine: dark yellow  Lab Results:  Recent Labs  10/08/15 1450 10/11/15 1049  HGB 13.7 13.0  HCT 40.8 38.2*    Assessment/Plan: POD#0   1) Continue to monitor  2) DVT prophy, clears, IS, amb, pain control   LOS: 0 days   Linnie Delgrande 10/11/2015, 2:27 PM

## 2015-10-12 LAB — HEMOGLOBIN AND HEMATOCRIT, BLOOD
HCT: 33.1 % — ABNORMAL LOW (ref 39.0–52.0)
Hemoglobin: 11.3 g/dL — ABNORMAL LOW (ref 13.0–17.0)

## 2015-10-12 LAB — GLUCOSE, CAPILLARY
GLUCOSE-CAPILLARY: 144 mg/dL — AB (ref 65–99)
Glucose-Capillary: 139 mg/dL — ABNORMAL HIGH (ref 65–99)
Glucose-Capillary: 161 mg/dL — ABNORMAL HIGH (ref 65–99)
Glucose-Capillary: 175 mg/dL — ABNORMAL HIGH (ref 65–99)

## 2015-10-12 MED ORDER — BISACODYL 10 MG RE SUPP
10.0000 mg | Freq: Once | RECTAL | Status: AC
  Administered 2015-10-12: 10 mg via RECTAL
  Filled 2015-10-12: qty 1

## 2015-10-12 MED ORDER — BELLADONNA ALKALOIDS-OPIUM 16.2-60 MG RE SUPP
1.0000 | Freq: Four times a day (QID) | RECTAL | Status: DC | PRN
  Administered 2015-10-12: 1 via RECTAL
  Filled 2015-10-12: qty 1

## 2015-10-12 MED ORDER — HYDROCODONE-ACETAMINOPHEN 5-325 MG PO TABS
1.0000 | ORAL_TABLET | Freq: Four times a day (QID) | ORAL | Status: DC | PRN
  Administered 2015-10-12: 1 via ORAL
  Filled 2015-10-12: qty 1

## 2015-10-12 NOTE — Discharge Summary (Signed)
  Date of admission: 10/11/2015  Date of discharge: 10/12/2015  Admission diagnosis: Prostate Cancer  Discharge diagnosis: Prostate Cancer  History and Physical: For full details, please see admission history and physical. Briefly, Jacob Nash is a 70 y.o. gentleman with localized prostate cancer.  After discussing management/treatment options, he elected to proceed with surgical treatment.  Hospital Course: ZAVIYAR RAHAL was taken to the operating room on 10/11/2015 and underwent a robotic assisted laparoscopic radical prostatectomy. He tolerated this procedure well and without complications. Postoperatively, he was able to be transferred to a regular hospital room following recovery from anesthesia.  He was able to begin ambulating the night of surgery. He remained hemodynamically stable overnight.  He had excellent urine output with appropriately minimal output from his pelvic drain and his pelvic drain was removed on POD #1.  He was transitioned to oral pain medication, tolerated a clear liquid diet, and had met all discharge criteria and was able to be discharged home later on POD#1.  Laboratory values:  Recent Labs  10/11/15 1049 10/12/15 0445  HGB 13.0 11.3*  HCT 38.2* 33.1*    Disposition: Home  Discharge instruction: He was instructed to be ambulatory but to refrain from heavy lifting, strenuous activity, or driving. He was instructed on urethral catheter care.  Discharge medications:     Medication List    STOP taking these medications        aspirin EC 81 MG tablet     Fish Oil 1200 MG Caps     meloxicam 15 MG tablet  Commonly known as:  MOBIC     multivitamin with minerals Tabs tablet     pseudoephedrine 30 MG tablet  Commonly known as:  SUDAFED     Vitamin D3 2000 units Tabs      TAKE these medications        acetaminophen 500 MG tablet  Commonly known as:  TYLENOL  Take 500 mg by mouth 2 (two) times daily.     ALPRAZolam 0.5 MG tablet  Commonly  known as:  XANAX  Take 0.5 mg by mouth 2 (two) times daily.     CLARITIN 10 MG tablet  Generic drug:  loratadine  Take 10 mg by mouth daily.     fluticasone 50 MCG/ACT nasal spray  Commonly known as:  FLONASE  Place 2 sprays into both nostrils daily as needed for allergies.     hydrochlorothiazide 12.5 MG capsule  Commonly known as:  MICROZIDE  Take 12.5 mg by mouth daily.     HYDROcodone-acetaminophen 5-325 MG tablet  Commonly known as:  NORCO  Take 1-2 tablets by mouth every 6 (six) hours as needed for moderate pain.     lisinopril 40 MG tablet  Commonly known as:  PRINIVIL,ZESTRIL  Take 40 mg by mouth daily.     omeprazole 20 MG capsule  Commonly known as:  PRILOSEC  Take 20 mg by mouth daily.     simvastatin 20 MG tablet  Commonly known as:  ZOCOR  Take 10 mg by mouth every evening.     sulfamethoxazole-trimethoprim 800-160 MG tablet  Commonly known as:  BACTRIM DS,SEPTRA DS  Take 1 tablet by mouth 2 (two) times daily. Start the day prior to foley removal appointment        Followup: He will followup in 1 week for catheter removal and to discuss his surgical pathology results.

## 2015-10-12 NOTE — Progress Notes (Signed)
Patient ID: Jacob Nash, male   DOB: Feb 04, 1946, 70 y.o.   MRN: [id number]  1 Day Post-Op Subjective: The patient is doing well.  No nausea or vomiting. Pain is adequately controlled.  Objective: Vital signs in last 24 hours: Temp:  [97.6 F (36.4 C)-98.2 F (36.8 C)] 98.1 F (36.7 C) (05/26 0433) Pulse Rate:  [66-83] 73 (05/26 0433) Resp:  [11-21] 18 (05/26 0433) BP: (109-152)/(73-87) 111/73 mmHg (05/26 0433) SpO2:  [96 %-100 %] 99 % (05/26 0433) Weight:  [102.967 kg (227 lb)] 102.967 kg (227 lb) (05/25 1428)  Intake/Output from previous day: 05/25 0701 - 05/26 0700 In: [id number] [P.O.:1440; I.V.:4460; IV Piggyback:50] Out: [id number] [Urine:3475; Drains:305; Blood:50] Intake/Output this shift:    Physical Exam:  General: Alert and oriented. CV: RRR Lungs: Clear bilaterally. GI: Soft, Nondistended. Incisions: Clean, dry, and intact Urine: Clear Extremities: Nontender, no erythema, no edema.  Lab Results:  Recent Labs  10/11/15 1049 10/12/15 0445  HGB 13.0 11.3*  HCT 38.2* 33.1*      Assessment/Plan: POD# 1 s/p robotic prostatectomy.  1) SL IVF 2) Ambulate, Incentive spirometry 3) Transition to oral pain medication 4) Dulcolax suppository 5) D/C pelvic drain 6) Plan for likely discharge later today   Pryor Curia. MD   LOS: 1 day   Nikolai Wilczak,LES 10/12/2015, 7:49 AM

## 2015-11-12 DIAGNOSIS — N393 Stress incontinence (female) (male): Secondary | ICD-10-CM | POA: Diagnosis not present

## 2015-11-12 DIAGNOSIS — M6281 Muscle weakness (generalized): Secondary | ICD-10-CM | POA: Diagnosis not present

## 2015-11-21 DIAGNOSIS — E1121 Type 2 diabetes mellitus with diabetic nephropathy: Secondary | ICD-10-CM | POA: Diagnosis not present

## 2015-11-21 DIAGNOSIS — I10 Essential (primary) hypertension: Secondary | ICD-10-CM | POA: Diagnosis not present

## 2015-11-21 DIAGNOSIS — E782 Mixed hyperlipidemia: Secondary | ICD-10-CM | POA: Diagnosis not present

## 2015-11-21 DIAGNOSIS — K210 Gastro-esophageal reflux disease with esophagitis: Secondary | ICD-10-CM | POA: Diagnosis not present

## 2015-11-26 DIAGNOSIS — N393 Stress incontinence (female) (male): Secondary | ICD-10-CM | POA: Diagnosis not present

## 2015-11-26 DIAGNOSIS — E1121 Type 2 diabetes mellitus with diabetic nephropathy: Secondary | ICD-10-CM | POA: Diagnosis not present

## 2015-11-26 DIAGNOSIS — I10 Essential (primary) hypertension: Secondary | ICD-10-CM | POA: Diagnosis not present

## 2015-11-26 DIAGNOSIS — F411 Generalized anxiety disorder: Secondary | ICD-10-CM | POA: Diagnosis not present

## 2015-11-26 DIAGNOSIS — Z6831 Body mass index (BMI) 31.0-31.9, adult: Secondary | ICD-10-CM | POA: Diagnosis not present

## 2015-11-26 DIAGNOSIS — K210 Gastro-esophageal reflux disease with esophagitis: Secondary | ICD-10-CM | POA: Diagnosis not present

## 2015-11-26 DIAGNOSIS — J3089 Other allergic rhinitis: Secondary | ICD-10-CM | POA: Diagnosis not present

## 2015-11-26 DIAGNOSIS — M6281 Muscle weakness (generalized): Secondary | ICD-10-CM | POA: Diagnosis not present

## 2015-11-26 DIAGNOSIS — C61 Malignant neoplasm of prostate: Secondary | ICD-10-CM | POA: Diagnosis not present

## 2015-11-26 DIAGNOSIS — E782 Mixed hyperlipidemia: Secondary | ICD-10-CM | POA: Diagnosis not present

## 2015-11-27 DIAGNOSIS — L570 Actinic keratosis: Secondary | ICD-10-CM | POA: Diagnosis not present

## 2015-11-27 DIAGNOSIS — D1800 Hemangioma unspecified site: Secondary | ICD-10-CM | POA: Diagnosis not present

## 2015-11-27 DIAGNOSIS — L821 Other seborrheic keratosis: Secondary | ICD-10-CM | POA: Diagnosis not present

## 2015-12-13 DIAGNOSIS — N393 Stress incontinence (female) (male): Secondary | ICD-10-CM | POA: Diagnosis not present

## 2015-12-13 DIAGNOSIS — C61 Malignant neoplasm of prostate: Secondary | ICD-10-CM | POA: Diagnosis not present

## 2015-12-13 DIAGNOSIS — M6281 Muscle weakness (generalized): Secondary | ICD-10-CM | POA: Diagnosis not present

## 2016-01-18 ENCOUNTER — Other Ambulatory Visit: Payer: Self-pay

## 2016-02-18 DIAGNOSIS — Z23 Encounter for immunization: Secondary | ICD-10-CM | POA: Diagnosis not present

## 2016-03-04 DIAGNOSIS — K529 Noninfective gastroenteritis and colitis, unspecified: Secondary | ICD-10-CM | POA: Diagnosis not present

## 2016-03-04 DIAGNOSIS — Z6831 Body mass index (BMI) 31.0-31.9, adult: Secondary | ICD-10-CM | POA: Diagnosis not present

## 2016-04-03 DIAGNOSIS — M1811 Unilateral primary osteoarthritis of first carpometacarpal joint, right hand: Secondary | ICD-10-CM | POA: Diagnosis not present

## 2016-04-03 DIAGNOSIS — M5442 Lumbago with sciatica, left side: Secondary | ICD-10-CM | POA: Diagnosis not present

## 2016-04-03 DIAGNOSIS — M545 Low back pain: Secondary | ICD-10-CM | POA: Diagnosis not present

## 2016-05-02 DIAGNOSIS — C61 Malignant neoplasm of prostate: Secondary | ICD-10-CM | POA: Diagnosis not present

## 2016-05-09 DIAGNOSIS — C61 Malignant neoplasm of prostate: Secondary | ICD-10-CM | POA: Diagnosis not present

## 2016-05-09 DIAGNOSIS — N393 Stress incontinence (female) (male): Secondary | ICD-10-CM | POA: Diagnosis not present

## 2016-05-27 DIAGNOSIS — E782 Mixed hyperlipidemia: Secondary | ICD-10-CM | POA: Diagnosis not present

## 2016-05-27 DIAGNOSIS — K210 Gastro-esophageal reflux disease with esophagitis: Secondary | ICD-10-CM | POA: Diagnosis not present

## 2016-05-27 DIAGNOSIS — E119 Type 2 diabetes mellitus without complications: Secondary | ICD-10-CM | POA: Diagnosis not present

## 2016-05-27 DIAGNOSIS — I10 Essential (primary) hypertension: Secondary | ICD-10-CM | POA: Diagnosis not present

## 2016-05-30 DIAGNOSIS — E1121 Type 2 diabetes mellitus with diabetic nephropathy: Secondary | ICD-10-CM | POA: Diagnosis not present

## 2016-05-30 DIAGNOSIS — C61 Malignant neoplasm of prostate: Secondary | ICD-10-CM | POA: Diagnosis not present

## 2016-05-30 DIAGNOSIS — J3089 Other allergic rhinitis: Secondary | ICD-10-CM | POA: Diagnosis not present

## 2016-05-30 DIAGNOSIS — Z6831 Body mass index (BMI) 31.0-31.9, adult: Secondary | ICD-10-CM | POA: Diagnosis not present

## 2016-05-30 DIAGNOSIS — F411 Generalized anxiety disorder: Secondary | ICD-10-CM | POA: Diagnosis not present

## 2016-05-30 DIAGNOSIS — I10 Essential (primary) hypertension: Secondary | ICD-10-CM | POA: Diagnosis not present

## 2016-05-30 DIAGNOSIS — K210 Gastro-esophageal reflux disease with esophagitis: Secondary | ICD-10-CM | POA: Diagnosis not present

## 2016-05-30 DIAGNOSIS — E782 Mixed hyperlipidemia: Secondary | ICD-10-CM | POA: Diagnosis not present

## 2016-07-03 DIAGNOSIS — K429 Umbilical hernia without obstruction or gangrene: Secondary | ICD-10-CM | POA: Insufficient documentation

## 2016-07-04 DIAGNOSIS — K429 Umbilical hernia without obstruction or gangrene: Secondary | ICD-10-CM | POA: Diagnosis not present

## 2016-07-24 ENCOUNTER — Encounter: Payer: Self-pay | Admitting: General Surgery

## 2016-07-24 ENCOUNTER — Ambulatory Visit (INDEPENDENT_AMBULATORY_CARE_PROVIDER_SITE_OTHER): Payer: Medicare Other | Admitting: General Surgery

## 2016-07-24 VITALS — BP 136/96 | HR 87 | Temp 98.7°F | Resp 18 | Ht 70.0 in | Wt 206.0 lb

## 2016-07-24 DIAGNOSIS — K432 Incisional hernia without obstruction or gangrene: Secondary | ICD-10-CM

## 2016-07-24 NOTE — Patient Instructions (Signed)
Ventral Hernia A ventral hernia is a bulge of tissue from inside the abdomen that pushes through a weak area of the muscles that form the front wall of the abdomen. The tissues inside the abdomen are inside a sac (peritoneum). These tissues include the small intestine, large intestine, and the fatty tissue that covers the intestines (omentum). Sometimes, the bulge that forms a hernia contains intestines. Other hernias contain only fat. Ventral hernias do not go away without surgical treatment. There are several types of ventral hernias. You may have:  A hernia at an incision site from previous abdominal surgery (incisional hernia).  A hernia just above the belly button (epigastric hernia), or at the belly button (umbilical hernia). These types of hernias can develop from heavy lifting or straining.  A hernia that comes and goes (reducible hernia). It may be visible only when you lift or strain. This type of hernia can be pushed back into the abdomen (reduced).  A hernia that traps abdominal tissue inside the hernia (incarcerated hernia). This type of hernia does not reduce.  A hernia that cuts off blood flow to the tissues inside the hernia (strangulated hernia). The tissues can start to die if this happens. This is a very painful bulge that cannot be reduced. A strangulated hernia is a medical emergency. What are the causes? This condition is caused by abdominal tissue putting pressure on an area of weakness in the abdominal muscles. What increases the risk? The following factors may make you more likely to develop this condition:  Being male.  Being 1 or older.  Being overweight or obese.  Having had previous abdominal surgery, especially if there was an infection after surgery.  Having had an injury to the abdominal wall.  Having had several pregnancies.  Having a buildup of fluid inside the abdomen (ascites). What are the signs or symptoms? The only symptom of a ventral hernia  may be a painless bulge in the abdomen. A reducible hernia may be visible only when you strain, cough, or lift. Other symptoms may include:  Dull pain.  A feeling of pressure. Signs and symptoms of a strangulated hernia may include:  Increasing pain.  Nausea and vomiting.  Pain when pressing on the hernia.  The skin over the hernia turning red or purple.  Constipation.  Blood in the stool (feces). How is this diagnosed? This condition may be diagnosed based on:  Your symptoms.  Your medical history.  A physical exam. You may be asked to cough or strain while standing. These actions increase the pressure inside your abdomen and force the hernia through the opening in your muscles. Your health care provider may try to reduce the hernia by pressing on it.  Imaging studies, such as an ultrasound or CT scan. How is this treated? This condition is treated with surgery. If you have a strangulated hernia, surgery is done as soon as possible. If your hernia is small and not incarcerated, you may be asked to lose some weight before surgery. Follow these instructions at home:  Follow instructions from your health care provider about eating or drinking restrictions.  If you are overweight, your health care provider may recommend that you increase your activity level and eat a healthier diet.  Do not lift anything that is heavier than 10 lb (4.5 kg).  Return to your normal activities as told by your health care provider. Ask your health care provider what activities are safe for you. You may need to avoid activities that  increase pressure on your hernia.  Take over-the-counter and prescription medicines only as told by your health care provider.  Keep all follow-up visits as told by your health care provider. This is important. Contact a health care provider if:  Your hernia gets larger.  Your hernia becomes painful. Get help right away if:  Your hernia becomes increasingly  painful.  You have pain along with any of the following:  Changes in skin color in the area of the hernia.  Nausea.  Vomiting.  Fever. Summary  A ventral hernia is a bulge of tissue from inside the abdomen that pushes through a weak area of the muscles that form the front wall of the abdomen.  This condition is treated with surgery, which may be urgent depending on your hernia.  Do not lift anything that is heavier than 10 lb (4.5 kg), and follow activity instructions from your health care provider. This information is not intended to replace advice given to you by your health care provider. Make sure you discuss any questions you have with your health care provider. Document Released: 04/21/2012 Document Revised: 12/21/2015 Document Reviewed: 12/21/2015 Elsevier Interactive Patient Education  2017 [employer].

## 2016-07-24 NOTE — H&P (Signed)
Jacob Nash; [id number]; May 08, 1946   HPI   Patient is a 71 year old white male status post robotic-assisted prostatectomy in the past who was noted to months ago to have increasing swelling with exertion of a mass just above his umbilicus.  It would come and go.  It was made worse with standing, straining, or heavy exertion.  It would resolve when lying down.  It is uncomfortable when sticking out, but he has had no nausea or vomiting.  He currently has no pain.     Past Medical History:  Diagnosis Date  . Arthritis   . Cancer [facility]) MARCH 2017   PROSTATE   . Diabetes mellitus without complication ([place])   . Elevated cholesterol   . Frequent urination   . GERD (gastroesophageal reflux disease)   . Hemorrhoids   . Hypertension   . Joint pain   . PONV (postoperative nausea and vomiting)    NAUSEA ONLY         Past Surgical History:  Procedure Laterality Date  . [place] AND 1996   LOWER BACK  . HERNIA REPAIR  03-21-2009    LEFT INGUINAL  . LYMPHADENECTOMY Bilateral 10/11/2015   Procedure: BILATERAL PELVIC LYMPHADENECTOMY;  Surgeon: Raynelle Bring, MD;  Location: [facility];  Service: Urology;  Laterality: Bilateral;  . PROSTATE BIOPSY  07-2015  . ROBOT ASSISTED LAPAROSCOPIC RADICAL PROSTATECTOMY N/A 10/11/2015   Procedure: XI ROBOTIC ASSISTED LAPAROSCOPIC RADICAL PROSTATECTOMY LEVEL 2;  Surgeon: Raynelle Bring, MD;  Location: [facility];  Service: Urology;  Laterality: N/A;  . SHOULDER SURGERY Right 06-06-14   MUSCLE TEAR    No family history on file.        Current Outpatient Prescriptions on File Prior to Visit  Medication Sig Dispense Refill  . acetaminophen (TYLENOL) 500 MG tablet Take 500 mg by mouth 2 (two) times daily.    Marland Kitchen ALPRAZolam (XANAX) 0.5 MG tablet Take 0.5 mg by mouth 2 (two) times daily.  5  . fluticasone (FLONASE) 50 MCG/ACT nasal spray Place 2 sprays into both nostrils daily as needed for allergies.   10  . hydrochlorothiazide  (MICROZIDE) 12.5 MG capsule Take 12.5 mg by mouth daily.  12  . HYDROcodone-acetaminophen (NORCO) 5-325 MG tablet Take 1-2 tablets by mouth every 6 (six) hours as needed for moderate pain. 30 tablet 0  . lisinopril (PRINIVIL,ZESTRIL) 40 MG tablet Take 40 mg by mouth daily.  12  . loratadine (CLARITIN) 10 MG tablet Take 10 mg by mouth daily.    Marland Kitchen omeprazole (PRILOSEC) 20 MG capsule Take 20 mg by mouth daily.  12  . simvastatin (ZOCOR) 20 MG tablet Take 10 mg by mouth every evening.   12  . sulfamethoxazole-trimethoprim (BACTRIM DS,SEPTRA DS) 800-160 MG tablet Take 1 tablet by mouth 2 (two) times daily. Start the day prior to foley removal appointment 6 tablet 0   No current facility-administered medications on file prior to visit.     No Known Allergies     History  Alcohol Use No       History  Smoking Status  . Never Smoker  Smokeless Tobacco  . Never Used    Review of Systems  Constitutional: Negative.   HENT: Positive for sinus pain.   Eyes: Negative.   Respiratory: Negative.   Cardiovascular: Negative.   Gastrointestinal: Negative.   Genitourinary: Negative.   Musculoskeletal: Positive for back pain and joint pain. Negative for neck pain.  Skin: Negative.   Neurological: Negative.  Endo/Heme/Allergies: Negative.   Psychiatric/Behavioral: Negative.     Objective      Vitals:   07/24/16 1135  BP: (!) 136/96  Pulse: 87  Resp: 18  Temp: 98.7 F (37.1 C)    Physical Exam  Constitutional: He is oriented to person, place, and time and well-developed, well-nourished, and in no distress.  HENT:  Head: Normocephalic and atraumatic.  Neck: Normal range of motion. Neck supple.  Cardiovascular: Normal rate, regular rhythm and normal heart sounds.   Pulmonary/Chest: Effort normal and breath sounds normal.  Abdominal: Soft. Bowel sounds are normal. He exhibits no distension. There is no tenderness. There is no rebound.  A 4-5 cm easily reducible  supraumbilical hernia was present below a surgical scar.  Neurological: He is alert and oriented to person, place, and time.  Skin: Skin is warm and dry.  Vitals reviewed.    Dr. Edythe Lynn notes reviewed. Assessment    Incisional hernia without obstructive symptoms Plan  patient is scheduled for an incisional herniorrhaphy with mesh on 08/11/2016.  The risks and benefits of the procedure including bleeding, infection, mesh use, and the possibility of recurrence of the hernia were fully explained to the patient, who gave informed consent.

## 2016-07-24 NOTE — Progress Notes (Signed)
Jacob Nash; [id number]; [birth year]   HPI   Patient is a 71 year old white male status post robotic-assisted prostatectomy in the past who was noted to months ago to have increasing swelling with exertion of a mass just above his umbilicus.  It would come and go.  It was made worse with standing, straining, or heavy exertion.  It would resolve when lying down.  It is uncomfortable when sticking out, but he has had no nausea or vomiting.  He currently has no pain. Past Medical History:  Diagnosis Date  . Arthritis   . Cancer [facility]) MARCH 2017   PROSTATE   . Diabetes mellitus without complication ([place])   . Elevated cholesterol   . Frequent urination   . GERD (gastroesophageal reflux disease)   . Hemorrhoids   . Hypertension   . Joint pain   . PONV (postoperative nausea and vomiting)    NAUSEA ONLY    Past Surgical History:  Procedure Laterality Date  . [place] AND 1996   LOWER BACK  . HERNIA REPAIR  03-21-2009    LEFT INGUINAL  . LYMPHADENECTOMY Bilateral 10/11/2015   Procedure: BILATERAL PELVIC LYMPHADENECTOMY;  Surgeon: Raynelle Bring, MD;  Location: [facility];  Service: Urology;  Laterality: Bilateral;  . PROSTATE BIOPSY  07-2015  . ROBOT ASSISTED LAPAROSCOPIC RADICAL PROSTATECTOMY N/A 10/11/2015   Procedure: XI ROBOTIC ASSISTED LAPAROSCOPIC RADICAL PROSTATECTOMY LEVEL 2;  Surgeon: Raynelle Bring, MD;  Location: [facility];  Service: Urology;  Laterality: N/A;  . SHOULDER SURGERY Right 06-06-14   MUSCLE TEAR    No family history on file.  Current Outpatient Prescriptions on File Prior to Visit  Medication Sig Dispense Refill  . acetaminophen (TYLENOL) 500 MG tablet Take 500 mg by mouth 2 (two) times daily.    Marland Kitchen ALPRAZolam (XANAX) 0.5 MG tablet Take 0.5 mg by mouth 2 (two) times daily.  5  . fluticasone (FLONASE) 50 MCG/ACT nasal spray Place 2 sprays into both nostrils daily as needed for allergies.   10  . hydrochlorothiazide (MICROZIDE) 12.5 MG capsule Take 12.5 mg by mouth  daily.  12  . HYDROcodone-acetaminophen (NORCO) 5-325 MG tablet Take 1-2 tablets by mouth every 6 (six) hours as needed for moderate pain. 30 tablet 0  . lisinopril (PRINIVIL,ZESTRIL) 40 MG tablet Take 40 mg by mouth daily.  12  . loratadine (CLARITIN) 10 MG tablet Take 10 mg by mouth daily.    Marland Kitchen omeprazole (PRILOSEC) 20 MG capsule Take 20 mg by mouth daily.  12  . simvastatin (ZOCOR) 20 MG tablet Take 10 mg by mouth every evening.   12  . sulfamethoxazole-trimethoprim (BACTRIM DS,SEPTRA DS) 800-160 MG tablet Take 1 tablet by mouth 2 (two) times daily. Start the day prior to foley removal appointment 6 tablet 0   No current facility-administered medications on file prior to visit.     No Known Allergies  History  Alcohol Use No    History  Smoking Status  . Never Smoker  Smokeless Tobacco  . Never Used    Review of Systems  Constitutional: Negative.   HENT: Positive for sinus pain.   Eyes: Negative.   Respiratory: Negative.   Cardiovascular: Negative.   Gastrointestinal: Negative.   Genitourinary: Negative.   Musculoskeletal: Positive for back pain and joint pain. Negative for neck pain.  Skin: Negative.   Neurological: Negative.   Endo/Heme/Allergies: Negative.   Psychiatric/Behavioral: Negative.     Objective   Vitals:   07/24/16 1135  BP: Marland Kitchen)  136/96  Pulse: 87  Resp: 18  Temp: 98.7 F (37.1 C)    Physical Exam  Constitutional: He is oriented to person, place, and time and well-developed, well-nourished, and in no distress.  HENT:  Head: Normocephalic and atraumatic.  Neck: Normal range of motion. Neck supple.  Cardiovascular: Normal rate, regular rhythm and normal heart sounds.   Pulmonary/Chest: Effort normal and breath sounds normal.  Abdominal: Soft. Bowel sounds are normal. He exhibits no distension. There is no tenderness. There is no rebound.  A 4-5 cm easily reducible supraumbilical hernia was present below a surgical scar.  Neurological: He is  alert and oriented to person, place, and time.  Skin: Skin is warm and dry.  Vitals reviewed.    Dr. Edythe Lynn notes reviewed. Assessment    Incisional hernia without obstructive symptoms Plan  patient is scheduled for an incisional herniorrhaphy with mesh on 08/11/2016.  The risks and benefits of the procedure including bleeding, infection, mesh use, and the possibility of recurrence of the hernia were fully explained to the patient, who gave informed consent.

## 2016-07-30 NOTE — Patient Instructions (Addendum)
General Anesthesia, Adult General anesthesia is the use of medicines to make a person "go to sleep" (be unconscious) for a medical procedure. General anesthesia is often recommended when a procedure:  Is long.  Requires you to be still or in an unusual position.  Is major and can cause you to lose blood.  Is impossible to do without general anesthesia. The medicines used for general anesthesia are called general anesthetics. In addition to making you sleep, the medicines:  Prevent pain.  Control your blood pressure.  Relax your muscles. Tell a health care provider about:  Any allergies you have.  All medicines you are taking, including vitamins, herbs, eye drops, creams, and over-the-counter medicines.  Any problems you or family members have had with anesthetic medicines.  Types of anesthetics you have had in the past.  Any bleeding disorders you have.  Any surgeries you have had.  Any medical conditions you have.  Any history of heart or lung conditions, such as heart failure, sleep apnea, or chronic obstructive pulmonary disease (COPD).  Whether you are pregnant or may be pregnant.  Whether you use tobacco, alcohol, marijuana, or street drugs.  Any history of Armed forces logistics/support/administrative officer.  Any history of depression or anxiety. What are the risks? Generally, this is a safe procedure. However, problems may occur, including:  Allergic reaction to anesthetics.  Lung and heart problems.  Inhaling food or liquids from your stomach into your lungs (aspiration).  Injury to nerves.  Waking up during your procedure and being unable to move (rare).  Extreme agitation or a state of mental confusion (delirium) when you wake up from the anesthetic.  Air in the bloodstream, which can lead to stroke. These problems are more likely to develop if you are having a major surgery or if you have an advanced medical condition. You can prevent some of these complications by  answering all of your health care provider's questions thoroughly and by following all pre-procedure instructions. General anesthesia can cause side effects, including:  Nausea or vomiting  A sore throat from the breathing tube.  Feeling cold or shivery.  Feeling tired, washed out, or achy.  Sleepiness or drowsiness.  Confusion or agitation. What happens before the procedure? Staying hydrated  Follow instructions from your health care provider about hydration, which may include:  Up to 2 hours before the procedure - you may continue to drink clear liquids, such as water, clear fruit juice, black coffee, and plain tea. Eating and drinking restrictions  Follow instructions from your health care provider about eating and drinking, which may include:  8 hours before the procedure - stop eating heavy meals or foods such as meat, fried foods, or fatty foods.  6 hours before the procedure - stop eating light meals or foods, such as toast or cereal.  6 hours before the procedure - stop drinking milk or drinks that contain milk.  2 hours before the procedure - stop drinking clear liquids. Medicines   Ask your health care provider about:  Changing or stopping your regular medicines. This is especially important if you are taking diabetes medicines or blood thinners.  Taking medicines such as aspirin and ibuprofen. These medicines can thin your blood. Do not take these medicines before your procedure if your health care provider instructs you not to.  Taking new dietary supplements or medicines. Do not take these during the week before your procedure unless your health care provider approves them.  If you  are told to take a medicine or to continue taking a medicine on the day of the procedure, take the medicine with sips of water. General instructions    Ask if you will be going home the same day, the following day, or after a longer hospital stay.  Plan to have someone take you  home.  Plan to have someone stay with you for the first 24 hours after you leave the hospital or clinic.  For 3-6 weeks before the procedure, try not to use any tobacco products, such as cigarettes, chewing tobacco, and e-cigarettes.  You may brush your teeth on the morning of the procedure, but make sure to spit out the toothpaste. What happens during the procedure?  You will be given anesthetics through a mask and through an IV tube in one of your veins.  You may receive medicine to help you relax (sedative).  As soon as you are asleep, a breathing tube may be used to help you breathe.  An anesthesia specialist will stay with you throughout the procedure. He or she will help keep you comfortable and safe by continuing to give you medicines and adjusting the amount of medicine that you get. He or she will also watch your blood pressure, pulse, and oxygen levels to make sure that the anesthetics do not cause any problems.  If a breathing tube was used to help you breathe, it will be removed before you wake up. The procedure may vary among health care providers and hospitals. What happens after the procedure?  You will wake up, often slowly, after the procedure is complete, usually in a recovery area.  Your blood pressure, heart rate, breathing rate, and blood oxygen level will be monitored until the medicines you were given have worn off.  You may be given medicine to help you calm down if you feel anxious or agitated.  If you will be going home the same day, your health care provider may check to make sure you can stand, drink, and urinate.  Your health care providers will treat your pain and side effects before you go home.  Do not drive for 24 hours if you received a sedative.  You may:  Feel nauseous and vomit.  Have a sore throat.  Have mental slowness.  Feel cold or shivery.  Feel sleepy.  Feel tired.  Feel sore or achy, even in parts of your body where you did  not have surgery. This information is not intended to replace advice given to you by your health care provider. Make sure you discuss any questions you have with your health care provider. Document Released: 08/12/2007 Document Revised: 10/16/2015 Document Reviewed: 04/19/2015 Elsevier Interactive Patient Education  2017 [place] Anesthesia, Adult, Care After These instructions provide you with information about caring for yourself after your procedure. Your health care provider may also give you more specific instructions. Your treatment has been planned according to current medical practices, but problems sometimes occur. Call your health care provider if you have any problems or questions after your procedure. What can I expect after the procedure? After the procedure, it is common to have:  Vomiting.  A sore throat.  Mental slowness. It is common to feel:  Nauseous.  Cold or shivery.  Sleepy.  Tired.  Sore or achy, even in parts of your body where you did not have surgery. Follow these instructions at home: For at least 24 hours after the procedure:   Do not:  Participate in  activities where you could fall or become injured.  Drive.  Use heavy machinery.  Drink alcohol.  Take sleeping pills or medicines that cause drowsiness.  Make important decisions or sign legal documents.  Take care of children on your own.  Rest. Eating and drinking   If you vomit, drink water, juice, or soup when you can drink without vomiting.  Drink enough fluid to keep your urine clear or pale yellow.  Make sure you have little or no nausea before eating solid foods.  Follow the diet recommended by your health care provider. General instructions   Have a responsible adult stay with you until you are awake and alert.  Return to your normal activities as told by your health care provider. Ask your health care provider what activities are safe for you.  Take  over-the-counter and prescription medicines only as told by your health care provider.  If you smoke, do not smoke without supervision.  Keep all follow-up visits as told by your health care provider. This is important. Contact a health care provider if:  You continue to have nausea or vomiting at home, and medicines are not helpful.  You cannot drink fluids or start eating again.  You cannot urinate after 8-12 hours.  You develop a skin rash.  You have fever.  You have increasing redness at the site of your procedure. Get help right away if:  You have difficulty breathing.  You have chest pain.  You have unexpected bleeding.  You feel that you are having a life-threatening or urgent problem. This information is not intended to replace advice given to you by your health care provider. Make sure you discuss any questions you have with your health care provider. Document Released: 08/11/2000 Document Revised: 10/08/2015 Document Reviewed: 04/19/2015 Elsevier Interactive Patient Education  2017 [place]  07/30/2016     @[email or web address] @   Your procedure is scheduled on  08/11/2016   Report to [facility] at  40  A.M.  Call this number if you have problems the morning of surgery:  [phone]   Remember:  Do not eat food or drink liquids after midnight.  Take these medicines the morning of surgery with A SIP OF WATER  Xanax, norco, lisnopril, claritin, prilosec.   Do not wear jewelry, make-up or nail polish.  Do not wear lotions, powders, or perfumes, or deoderant.  Do not shave 48 hours prior to surgery.  Men may shave face and neck.  Do not bring valuables to the hospital.  [facility] is not responsible for any belongings or valuables.  Contacts, dentures or bridgework may not be worn into surgery.  Leave your suitcase in the car.  After surgery it may be brought to your room.  For patients admitted to the hospital, discharge time  will be determined by your treatment team.  Patients discharged the day of surgery will not be allowed to drive home.   Name and phone number of your driver:   family Special instructions:  None  Please read over the following fact sheets that you were given. Anesthesia Post-op Instructions and Care and Recovery After Surgery       Laparoscopic Ventral Hernia Repair Laparoscopic ventral hernia repairis a procedure to fix a bulge of tissue that pushes through a weak area of muscle in the abdomen (ventral hernia). A ventral hernia may be at the belly button (umbilical), above the belly button (epigastric), or at  the incision site from previous abdominal surgery (incisional hernia). You may have this procedure as emergency surgery if part of your intestine gets trapped inside the hernia and starts to lose its blood supply (strangulation). Laparoscopic surgery is done through small incisions using a thin surgical telescope with a light and camera on the end (laparoscope). During surgery, your surgeon will use images from the laparoscope to guide the procedure. A mesh screen will be placed in the hernia to close the opening and strengthen the abdominal wall. Tell a health care provider about:  Any allergies you have.  All medicines you are taking, including vitamins, herbs, eye drops, creams, and over-the-counter medicines.  Any problems you or family members have had with anesthetic medicines.  Any blood disorders you have.  Any surgeries you have had.  Any medical conditions you have.  Whether you are pregnant or may be pregnant. What are the risks? Generally, this is a safe procedure. However, problems may occur, including:  Infection.  Bleeding.  Allergic reactions to medicines.  Damage to other structures or organs in the abdomen.  Trouble urinating or having a bowel movement after surgery.  Pneumonia.  Blood clots.  The hernia coming back after surgery.  Fluid  buildup in the area of the hernia. In some cases, your health care provider may need to switch from a laparoscopic procedure to a procedure that is done through a single, larger incision in the abdomen (open procedure). You may need an open procedure if:  You have a hernia that is difficult to repair.  Your organs are hard to see.  You have bleeding problems during the laparoscopic procedure. What happens before the procedure? Staying hydrated  Follow instructions from your health care provider about hydration, which may include:  Up to 2 hours before the procedure - you may continue to drink clear liquids, such as water, clear fruit juice, black coffee, and plain tea. Eating and drinking restrictions  Follow instructions from your health care provider about eating and drinking, which may include:  8 hours before the procedure - stop eating heavy meals or foods such as meat, fried foods, or fatty foods.  6 hours before the procedure - stop eating light meals or foods, such as toast or cereal.  6 hours before the procedure - stop drinking milk or drinks that contain milk.  2 hours before the procedure - stop drinking clear liquids. Medicines   Ask your health care provider about:  Changing or stopping your regular medicines. This is especially important if you are taking diabetes medicines or blood thinners.  Taking medicines such as aspirin and ibuprofen. These medicines can thin your blood. Do not take these medicines before your procedure if your health care provider instructs you not to.  You may be given antibiotic medicine to help prevent infection. General instructions   You may be asked to take a laxative or do an enema to empty your bowel before surgery (bowel prep).  Do not use any products that contain nicotine or tobacco, such as cigarettes and e-cigarettes. If you need help quitting, ask your health care provider.  You may need to have tests before the procedure,  such as:  Blood tests.  Urine tests.  Abdominal ultrasound.  Chest X-ray.  Electrocardiogram (ECG).  Plan to have someone take you home from the hospital or clinic.  If you will be going home right after the procedure, plan to have someone with you for 24 hours. What happens during the  procedure?  To reduce your risk of infection:  Your health care team will wash or sanitize their hands.  Your skin will be washed with soap.  An IV tube will be inserted into one of your veins.  You will be given one or more of the following:  A medicine to help you relax (sedative).  A medicine to make you fall asleep (general anesthetic).  A small incision will be made in your abdomen. A hollow metal tube (trocar) will be placed through the incision.  A tube will be placed through the trocar to inflate your abdomen with air-like gas. This makes it easier for your surgeon to see inside your abdomen and do the repair.  The laparoscope will be inserted into your abdomen through the trocar. The laparoscope will send images to a monitor in the operating room.  Other trocars will be put through other small incisions in your abdomen. The surgical instruments needed for the procedure will be placed through these trocars.  The tissue or intestines that make up the hernia will be moved back into place.  The edges of the hernia may be stitched together.  A piece of mesh will be used to close the hernia. Stitches (sutures), clips, or staples will be used to keep the mesh in place.  A bandage (dressing) or skin glue will be put over the incisions. The procedure may vary among health care providers and hospitals. What happens after the procedure?  Your blood pressure, heart rate, breathing rate, and blood oxygen level will be monitored until the medicines you were given have worn off.  You will continue to receive fluids and medicines through an IV tube. Your IV tube will be removed when you can  drink clear fluids.  You will be given pain medicine as needed.  You will be encouraged to get up and walk around as soon as possible.  You may have to wear compression stockings. These stockings help to prevent blood clots and reduce swelling in your legs.  You will be shown how to do deep breathing exercises to help prevent a lung infection.  Do not drive for 24 hours if you were given a sedative. This information is not intended to replace advice given to you by your health care provider. Make sure you discuss any questions you have with your health care provider. Document Released: 04/21/2012 Document Revised: 12/21/2015 Document Reviewed: 12/21/2015 Elsevier Interactive Patient Education  2017 [employer]  Laparoscopic Ventral Hernia Repair, Care After This sheet gives you information about how to care for yourself after your procedure. Your health care provider may also give you more specific instructions. If you have problems or questions, contact your health care provider. What can I expect after the procedure? After the procedure, it is common to have:  Pain, discomfort, or soreness. Follow these instructions at home: Incision care   Follow instructions from your health care provider about how to take care of your incision. Make sure you:  Wash your hands with soap and water before you change your bandage (dressing) or before you touch your abdomen. If soap and water are not available, use hand sanitizer.  Change your dressing as told by your health care provider.  Leave stitches (sutures), skin glue, or adhesive strips in place. These skin closures may need to stay in place for 2 weeks or longer. If adhesive strip edges start to loosen and curl up, you may trim the loose edges. Do not remove adhesive strips completely  unless your health care provider tells you to do that.  Check your incision area every day for signs of infection. Check for:  Redness, swelling, or  pain.  Fluid or blood.  Warmth.  Pus or a bad smell. Bathing   Do not take baths, swim, or use a hot tub until your health care provider approves. Ask your health care provider if you can take showers. You may only be allowed to take sponge baths for bathing.  Keep your bandage (dressing) dry until your health care provider says it can be removed. Activity   Do not lift anything that is heavier than 10 lb (4.5 kg) until your health care provider approves.  Do not drive or use heavy machinery while taking prescription pain medicine. Ask your health care provider when it is safe for you to drive or use heavy machinery.  Do not drive for 24 hours if you were given a medicine to help you relax (sedative) during your procedure.  Rest as told by your health care provider. You may return to your normal activities when your health care provider approves. General instructions   Take over-the-counter and prescription medicines only as told by your health care provider.  To prevent or treat constipation while you are taking prescription pain medicine, your health care provider may recommend that you:  Take over-the-counter or prescription medicines.  Eat foods that are high in fiber, such as fresh fruits and vegetables, whole grains, and beans.  Limit foods that are high in fat and processed sugars, such as fried and sweet foods.  Drink enough fluid to keep your urine clear or pale yellow.  Hold a pillow over your abdomen when you cough or sneeze. This helps with pain.  Keep all follow-up visits as told by your health care provider. This is important. Contact a health care provider if:  You have:  A fever or chills.  Redness, swelling, or pain around your incision.  Fluid or blood coming from your incision.  Pus or a bad smell coming from your incision.  Pain that gets worse or does not get better with medicine.  Nausea or vomiting.  A cough.  Shortness of breath.  Your  incision feels warm to the touch.  You have not had a bowel movement in three days.  You are not able to urinate. Get help right away if:  You have severe pain in your abdomen.  You have persistent nausea and vomiting.  You have redness, warmth, or pain in your leg.  You have chest pain.  You have trouble breathing. Summary  After this procedure, it is common to have pain, discomfort, or soreness.  Follow instructions from your health care provider about how to take care of your incision.  Check your incision area every day for signs of infection. Report any signs of infection to your health care provider.  Keep all follow-up visits as told by your health care provider. This is important. This information is not intended to replace advice given to you by your health care provider. Make sure you discuss any questions you have with your health care provider. Document Released: 04/21/2012 Document Revised: 12/26/2015 Document Reviewed: 12/26/2015 Elsevier Interactive Patient Education  2017 [employer].

## 2016-08-04 MED ORDER — CHLORHEXIDINE GLUCONATE CLOTH 2 % EX PADS: 6.0000 | MEDICATED_PAD | Freq: Once | CUTANEOUS | Status: DC

## 2016-08-05 ENCOUNTER — Encounter (HOSPITAL_COMMUNITY): Payer: Self-pay

## 2016-08-05 ENCOUNTER — Encounter (HOSPITAL_COMMUNITY)
Admission: RE | Admit: 2016-08-05 | Discharge: 2016-08-05 | Disposition: A | Discharge status: Home / Self Care | Payer: Medicare Other | Source: Ambulatory Visit | Attending: General Surgery | Admitting: General Surgery

## 2016-08-05 DIAGNOSIS — Z01812 Encounter for preprocedural laboratory examination: Secondary | ICD-10-CM | POA: Insufficient documentation

## 2016-08-05 DIAGNOSIS — K432 Incisional hernia without obstruction or gangrene: Secondary | ICD-10-CM | POA: Insufficient documentation

## 2016-08-05 HISTORY — DX: Anxiety disorder, unspecified: F41.9

## 2016-08-05 LAB — CBC WITH DIFFERENTIAL/PLATELET
Basophils Absolute: 0 10*3/uL (ref 0.0–0.1)
Basophils Relative: 0 %
EOS ABS: 0.1 10*3/uL (ref 0.0–0.7)
Eosinophils Relative: 1 %
HEMATOCRIT: 43.7 % (ref 39.0–52.0)
HEMOGLOBIN: 14.8 g/dL (ref 13.0–17.0)
LYMPHS ABS: 2.2 10*3/uL (ref 0.7–4.0)
LYMPHS PCT: 36 %
MCH: 30.6 pg (ref 26.0–34.0)
MCHC: 33.9 g/dL (ref 30.0–36.0)
MCV: 90.5 fL (ref 78.0–100.0)
MONOS PCT: 10 %
Monocytes Absolute: 0.6 10*3/uL (ref 0.1–1.0)
NEUTROS PCT: 53 %
Neutro Abs: 3.2 10*3/uL (ref 1.7–7.7)
Platelets: 269 10*3/uL (ref 150–400)
RBC: 4.83 MIL/uL (ref 4.22–5.81)
RDW: 12.8 % (ref 11.5–15.5)
WBC: 6 10*3/uL (ref 4.0–10.5)

## 2016-08-06 LAB — BASIC METABOLIC PANEL
ANION GAP: 12 (ref 5–15)
BUN: 12 mg/dL (ref 6–20)
CHLORIDE: 103 mmol/L (ref 101–111)
CO2: 22 mmol/L (ref 22–32)
Calcium: 9.6 mg/dL (ref 8.9–10.3)
Creatinine, Ser: 0.79 mg/dL (ref 0.61–1.24)
Glucose, Bld: 88 mg/dL (ref 65–99)
POTASSIUM: 4.6 mmol/L (ref 3.5–5.1)
SODIUM: 137 mmol/L (ref 135–145)

## 2016-08-11 ENCOUNTER — Ambulatory Visit (HOSPITAL_COMMUNITY): Payer: Medicare Other | Admitting: Anesthesiology

## 2016-08-11 ENCOUNTER — Encounter (HOSPITAL_COMMUNITY): Payer: Self-pay | Admitting: Anesthesiology

## 2016-08-11 ENCOUNTER — Ambulatory Visit (HOSPITAL_COMMUNITY)
Admission: RE | Admit: 2016-08-11 | Discharge: 2016-08-11 | Disposition: A | Discharge status: Home / Self Care | Payer: Medicare Other | Source: Ambulatory Visit | Attending: General Surgery | Admitting: General Surgery

## 2016-08-11 ENCOUNTER — Encounter (HOSPITAL_COMMUNITY)
Admission: RE | Disposition: A | Discharge status: Home / Self Care | Payer: Self-pay | Source: Ambulatory Visit | Attending: General Surgery

## 2016-08-11 DIAGNOSIS — K432 Incisional hernia without obstruction or gangrene: Secondary | ICD-10-CM | POA: Diagnosis not present

## 2016-08-11 DIAGNOSIS — Z8546 Personal history of malignant neoplasm of prostate: Secondary | ICD-10-CM | POA: Diagnosis not present

## 2016-08-11 DIAGNOSIS — E7800 Pure hypercholesterolemia, unspecified: Secondary | ICD-10-CM | POA: Diagnosis not present

## 2016-08-11 DIAGNOSIS — F419 Anxiety disorder, unspecified: Secondary | ICD-10-CM | POA: Insufficient documentation

## 2016-08-11 DIAGNOSIS — I10 Essential (primary) hypertension: Secondary | ICD-10-CM | POA: Insufficient documentation

## 2016-08-11 DIAGNOSIS — Z7982 Long term (current) use of aspirin: Secondary | ICD-10-CM | POA: Insufficient documentation

## 2016-08-11 DIAGNOSIS — Z79899 Other long term (current) drug therapy: Secondary | ICD-10-CM | POA: Insufficient documentation

## 2016-08-11 DIAGNOSIS — K219 Gastro-esophageal reflux disease without esophagitis: Secondary | ICD-10-CM | POA: Diagnosis not present

## 2016-08-11 DIAGNOSIS — E119 Type 2 diabetes mellitus without complications: Secondary | ICD-10-CM | POA: Diagnosis not present

## 2016-08-11 HISTORY — PX: INCISIONAL HERNIA REPAIR: SHX193

## 2016-08-11 LAB — GLUCOSE, CAPILLARY: Glucose-Capillary: 116 mg/dL — ABNORMAL HIGH (ref 65–99)

## 2016-08-11 SURGERY — REPAIR, HERNIA, INCISIONAL
Anesthesia: General

## 2016-08-11 MED ORDER — ONDANSETRON HCL 4 MG/2ML IJ SOLN
4.0000 mg | Freq: Once | INTRAMUSCULAR | Status: AC
  Administered 2016-08-11: 4 mg via INTRAVENOUS

## 2016-08-11 MED ORDER — GLYCOPYRROLATE 0.2 MG/ML IJ SOLN
INTRAMUSCULAR | Status: DC | PRN
  Administered 2016-08-11: .8 mg via INTRAVENOUS

## 2016-08-11 MED ORDER — POVIDONE-IODINE 10 % OINT PACKET
TOPICAL_OINTMENT | CUTANEOUS | Status: DC | PRN
  Administered 2016-08-11: 1 via TOPICAL

## 2016-08-11 MED ORDER — SCOPOLAMINE 1 MG/3DAYS TD PT72
MEDICATED_PATCH | TRANSDERMAL | Status: AC
  Filled 2016-08-11: qty 1

## 2016-08-11 MED ORDER — GLYCOPYRROLATE 0.2 MG/ML IJ SOLN
INTRAMUSCULAR | Status: AC
  Filled 2016-08-11: qty 4

## 2016-08-11 MED ORDER — LIDOCAINE HCL (PF) 1 % IJ SOLN
INTRAMUSCULAR | Status: AC
  Filled 2016-08-11: qty 5

## 2016-08-11 MED ORDER — HYDROCODONE-ACETAMINOPHEN 5-325 MG PO TABS: 1.0000 | ORAL_TABLET | Freq: Four times a day (QID) | ORAL | 0 refills | Status: DC | PRN

## 2016-08-11 MED ORDER — DEXAMETHASONE SODIUM PHOSPHATE 4 MG/ML IJ SOLN
INTRAMUSCULAR | Status: AC
  Filled 2016-08-11: qty 1

## 2016-08-11 MED ORDER — HYDROMORPHONE HCL 1 MG/ML IJ SOLN
INTRAMUSCULAR | Status: AC
  Filled 2016-08-11: qty 1

## 2016-08-11 MED ORDER — MIDAZOLAM HCL 2 MG/2ML IJ SOLN
1.0000 mg | INTRAMUSCULAR | Status: AC
  Administered 2016-08-11 (×2): 2 mg via INTRAVENOUS
  Filled 2016-08-11: qty 2

## 2016-08-11 MED ORDER — KETOROLAC TROMETHAMINE 30 MG/ML IJ SOLN
30.0000 mg | Freq: Once | INTRAMUSCULAR | Status: AC
  Administered 2016-08-11: 30 mg via INTRAVENOUS
  Filled 2016-08-11: qty 1

## 2016-08-11 MED ORDER — BUPIVACAINE HCL (PF) 0.5 % IJ SOLN
INTRAMUSCULAR | Status: DC | PRN
  Administered 2016-08-11: 10 mL

## 2016-08-11 MED ORDER — PROPOFOL 10 MG/ML IV BOLUS
INTRAVENOUS | Status: AC
Start: 2016-08-11 — End: 2016-08-11
  Filled 2016-08-11: qty 20

## 2016-08-11 MED ORDER — ONDANSETRON HCL 4 MG/2ML IJ SOLN
INTRAMUSCULAR | Status: AC
  Filled 2016-08-11: qty 2

## 2016-08-11 MED ORDER — EPHEDRINE SULFATE 50 MG/ML IJ SOLN
INTRAMUSCULAR | Status: DC | PRN
  Administered 2016-08-11 (×3): 10 mg via INTRAVENOUS

## 2016-08-11 MED ORDER — CHLORHEXIDINE GLUCONATE CLOTH 2 % EX PADS: 6.0000 | MEDICATED_PAD | Freq: Once | CUTANEOUS | Status: DC

## 2016-08-11 MED ORDER — SCOPOLAMINE 1 MG/3DAYS TD PT72
1.0000 | MEDICATED_PATCH | Freq: Once | TRANSDERMAL | Status: DC
  Administered 2016-08-11: 1.5 mg via TRANSDERMAL

## 2016-08-11 MED ORDER — ROCURONIUM BROMIDE 50 MG/5ML IV SOLN
INTRAVENOUS | Status: AC
  Filled 2016-08-11: qty 1

## 2016-08-11 MED ORDER — MIDAZOLAM HCL 2 MG/2ML IJ SOLN
INTRAMUSCULAR | Status: AC
  Filled 2016-08-11: qty 2

## 2016-08-11 MED ORDER — DEXAMETHASONE SODIUM PHOSPHATE 4 MG/ML IJ SOLN
4.0000 mg | Freq: Once | INTRAMUSCULAR | Status: AC
  Administered 2016-08-11: 4 mg via INTRAVENOUS

## 2016-08-11 MED ORDER — HYDROMORPHONE HCL 1 MG/ML IJ SOLN
0.2500 mg | INTRAMUSCULAR | Status: DC | PRN
  Administered 2016-08-11 (×3): 0.5 mg via INTRAVENOUS
  Filled 2016-08-11: qty 0.5

## 2016-08-11 MED ORDER — LACTATED RINGERS IV SOLN
INTRAVENOUS | Status: DC
  Administered 2016-08-11: 1000 mL via INTRAVENOUS
  Administered 2016-08-11: 09:00:00 via INTRAVENOUS

## 2016-08-11 MED ORDER — NEOSTIGMINE METHYLSULFATE 10 MG/10ML IV SOLN
INTRAVENOUS | Status: DC | PRN
  Administered 2016-08-11: 4 mg via INTRAVENOUS

## 2016-08-11 MED ORDER — CEFAZOLIN SODIUM-DEXTROSE 2-4 GM/100ML-% IV SOLN
2.0000 g | INTRAVENOUS | Status: AC
  Administered 2016-08-11: 2 g via INTRAVENOUS
  Filled 2016-08-11: qty 100

## 2016-08-11 MED ORDER — FENTANYL CITRATE (PF) 100 MCG/2ML IJ SOLN
INTRAMUSCULAR | Status: DC | PRN
  Administered 2016-08-11 (×2): 50 ug via INTRAVENOUS

## 2016-08-11 MED ORDER — 0.9 % SODIUM CHLORIDE (POUR BTL) OPTIME
TOPICAL | Status: DC | PRN
  Administered 2016-08-11: 1000 mL

## 2016-08-11 MED ORDER — LIDOCAINE HCL (CARDIAC) 10 MG/ML IV SOLN
INTRAVENOUS | Status: DC | PRN
  Administered 2016-08-11: 50 mg via INTRAVENOUS

## 2016-08-11 MED ORDER — FENTANYL CITRATE (PF) 250 MCG/5ML IJ SOLN
INTRAMUSCULAR | Status: AC
  Filled 2016-08-11: qty 5

## 2016-08-11 MED ORDER — ROCURONIUM 10MG/ML (10ML) SYRINGE FOR MEDFUSION PUMP - OPTIME
INTRAVENOUS | Status: DC | PRN
  Administered 2016-08-11: 35 mg via INTRAVENOUS

## 2016-08-11 MED ORDER — BUPIVACAINE HCL (PF) 0.5 % IJ SOLN
INTRAMUSCULAR | Status: AC
  Filled 2016-08-11: qty 30

## 2016-08-11 MED ORDER — PROPOFOL 10 MG/ML IV BOLUS
INTRAVENOUS | Status: DC | PRN
  Administered 2016-08-11: 150 mg via INTRAVENOUS

## 2016-08-11 MED ORDER — POVIDONE-IODINE 10 % EX OINT
TOPICAL_OINTMENT | CUTANEOUS | Status: AC
  Filled 2016-08-11: qty 1

## 2016-08-11 SURGICAL SUPPLY — 36 items
BAG HAMPER (MISCELLANEOUS) ×2 IMPLANT
BLADE SURG SZ11 CARB STEEL (BLADE) ×2 IMPLANT
CHLORAPREP W/TINT 26ML (MISCELLANEOUS) ×2 IMPLANT
CLOTH BEACON ORANGE TIMEOUT ST (SAFETY) ×2 IMPLANT
COVER LIGHT HANDLE STERIS (MISCELLANEOUS) ×4 IMPLANT
DECANTER SPIKE VIAL GLASS SM (MISCELLANEOUS) ×1 IMPLANT
ELECT REM PT RETURN 9FT ADLT (ELECTROSURGICAL) ×2
ELECTRODE REM PT RTRN 9FT ADLT (ELECTROSURGICAL) ×1 IMPLANT
GAUZE SPONGE 4X4 12PLY STRL (GAUZE/BANDAGES/DRESSINGS) ×2 IMPLANT
GLOVE BIOGEL PI IND STRL 7.0 (GLOVE) ×1 IMPLANT
GLOVE BIOGEL PI INDICATOR 7.0 (GLOVE) ×2
GLOVE SURG SS PI 7.5 STRL IVOR (GLOVE) ×2 IMPLANT
GOWN STRL REUS W/ TWL LRG LVL3 (GOWN DISPOSABLE) ×1 IMPLANT
GOWN STRL REUS W/ TWL XL LVL3 (GOWN DISPOSABLE) ×1 IMPLANT
GOWN STRL REUS W/TWL LRG LVL3 (GOWN DISPOSABLE) ×2
GOWN STRL REUS W/TWL XL LVL3 (GOWN DISPOSABLE) ×2
INST SET MINOR GENERAL (KITS) ×1 IMPLANT
KIT ROOM TURNOVER APOR (KITS) ×2 IMPLANT
MANIFOLD NEPTUNE II (INSTRUMENTS) ×2 IMPLANT
MESH VENTRALEX ST 8CM LRG (Mesh General) ×1 IMPLANT
NDL HYPO 21X1.5 SAFETY (NEEDLE) ×1 IMPLANT
NDL HYPO 25X1 1.5 SAFETY (NEEDLE) IMPLANT
NEEDLE HYPO 21X1.5 SAFETY (NEEDLE) ×2 IMPLANT
NEEDLE HYPO 25X1 1.5 SAFETY (NEEDLE) ×2 IMPLANT
NS IRRIG 1000ML POUR BTL (IV SOLUTION) ×2 IMPLANT
PACK MINOR (CUSTOM PROCEDURE TRAY) ×1 IMPLANT
PAD ARMBOARD 7.5X6 YLW CONV (MISCELLANEOUS) ×2 IMPLANT
SET BASIN LINEN APH (SET/KITS/TRAYS/PACK) ×2 IMPLANT
STAPLER VISISTAT (STAPLE) ×1 IMPLANT
SUT ETHIBOND 0 MO6 C/R (SUTURE) ×2 IMPLANT
SUT VIC AB 2-0 CT1 27 (SUTURE) ×2
SUT VIC AB 2-0 CT1 TAPERPNT 27 (SUTURE) ×1 IMPLANT
SUT VIC AB 3-0 SH 27 (SUTURE) ×2
SUT VIC AB 3-0 SH 27X BRD (SUTURE) ×1 IMPLANT
SYR CONTROL 10ML LL (SYRINGE) ×1 IMPLANT
TAPE CLOTH SURG 4X10 WHT LF (GAUZE/BANDAGES/DRESSINGS) ×1 IMPLANT

## 2016-08-11 NOTE — Op Note (Signed)
Patient:  Jacob Nash  DOB:  [birth year]  MRN:  [id number]   Preop Diagnosis:  Incisional hernia  Postop Diagnosis:  Same  Procedure:  Incisional herniorrhaphy with mesh  Surgeon:  Aviva Signs, M.D.  Anes:  Gen. endotracheal  Indications:  Patient is a 71 year old white male who has undergone multiple abdominal surgeries in the past who presents with an incisional hernia just above the umbilicus. This is beneath a surgical scar. The risks and benefits of the procedure including bleeding, infection, mesh use, the possibility of recurrence of the hernia were fully explained to the patient, who gave informed consent.  Procedure note:  The patient was placed in the supine position. After induction of general endotracheal anesthesia, the abdomen was prepped and draped using usual sterile technique with DuraPrep. Surgical site confirmation was performed.  An incision was made through the previous surgical scar that was superior to the umbilicus and along the midline. The dissection was taken down to the hernia defect. A hernia sac was found. His was entered into. No bowel was incarcerated in the hernia defect. The hernia defect measured approximately 4 cm in its greatest diameter. The hernia sac was excised to the fascia. A Bard 8 cm ventralax ST patch was then inserted and secured to the fascia using 0 Ethibond interrupted sutures. The fascia was then reapproximated transversely using 0 Ethibond interrupted sutures. The subcutaneous layer was reapproximated using 20 and 3-0 Vicryl interrupted sutures. 0.5% Sensorcaine was instilled into the surrounding wound. The skin was closed using staples. Betadine ointment and dry sterile dressings were applied.  All tape and needle counts were correct at the end of the procedure. The patient was extubated in the operating room and transferred to PACU in stable condition.  Complications:  None  EBL:  Minimal  Specimen:  None

## 2016-08-11 NOTE — Discharge Instructions (Signed)
Open Hernia Repair, Adult, Care After This sheet gives you information about how to care for yourself after your procedure. Your health care provider may also give you more specific instructions. If you have problems or questions, contact your health care provider. What can I expect after the procedure? After the procedure, it is common to have:  Mild discomfort.  Slight bruising.  Minor swelling.  Pain in the abdomen. Follow these instructions at home: Incision care    Follow instructions from your health care provider about how to take care of your incision area. Make sure you:  Wash your hands with soap and water before you change your bandage (dressing). If soap and water are not available, use hand sanitizer.  Change your dressing as told by your health care provider.  Leave stitches (sutures), skin glue, or adhesive strips in place. These skin closures may need to stay in place for 2 weeks or longer. If adhesive strip edges start to loosen and curl up, you may trim the loose edges. Do not remove adhesive strips completely unless your health care provider tells you to do that.  Check your incision area every day for signs of infection. Check for:  More redness, swelling, or pain.  More fluid or blood.  Warmth.  Pus or a bad smell. Activity   Do not drive or use heavy machinery while taking prescription pain medicine. Do not drive until your health care provider approves.  Until your health care provider approves:  Do not lift anything that is heavier than 10 lb (4.5 kg).  Do not play contact sports.  Return to your normal activities as told by your health care provider. Ask your health care provider what activities are safe. General instructions   To prevent or treat constipation while you are taking prescription pain medicine, your health care provider may recommend that you:  Drink enough fluid to keep your urine clear or pale yellow.  Take over-the-counter or  prescription medicines.  Eat foods that are high in fiber, such as fresh fruits and vegetables, whole grains, and beans.  Limit foods that are high in fat and processed sugars, such as fried and sweet foods.  Take over-the-counter and prescription medicines only as told by your health care provider.  Do not take tub baths or go swimming until your health care provider approves.  Keep all follow-up visits as told by your health care provider. This is important. Contact a health care provider if:  You develop a rash.  You have more redness, swelling, or pain around your incision.  You have more fluid or blood coming from your incision.  Your incision feels warm to the touch.  You have pus or a bad smell coming from your incision.  You have a fever or chills.  You have blood in your stool (feces).  You have not had a bowel movement in 2-3 days.  Your pain is not controlled with medicine. Get help right away if:  You have chest pain or shortness of breath.  You feel light-headed or feel faint.  You have severe pain.  You vomit and your pain is worse. This information is not intended to replace advice given to you by your health care provider. Make sure you discuss any questions you have with your health care provider. Document Released: 11/22/2004 Document Revised: 11/23/2015 Document Reviewed: 10/17/2015 Elsevier Interactive Patient Education  2017 [employer] Ventral Hernia A ventral hernia is a bulge of tissue from inside the abdomen that  pushes through a weak area of the muscles that form the front wall of the abdomen. The tissues inside the abdomen are inside a sac (peritoneum). These tissues include the small intestine, large intestine, and the fatty tissue that covers the intestines (omentum). Sometimes, the bulge that forms a hernia contains intestines. Other hernias contain only fat. Ventral hernias do not go away without surgical treatment. There are several types  of ventral hernias. You may have:  A hernia at an incision site from previous abdominal surgery (incisional hernia).  A hernia just above the belly button (epigastric hernia), or at the belly button (umbilical hernia). These types of hernias can develop from heavy lifting or straining.  A hernia that comes and goes (reducible hernia). It may be visible only when you lift or strain. This type of hernia can be pushed back into the abdomen (reduced).  A hernia that traps abdominal tissue inside the hernia (incarcerated hernia). This type of hernia does not reduce.  A hernia that cuts off blood flow to the tissues inside the hernia (strangulated hernia). The tissues can start to die if this happens. This is a very painful bulge that cannot be reduced. A strangulated hernia is a medical emergency. What are the causes? This condition is caused by abdominal tissue putting pressure on an area of weakness in the abdominal muscles. What increases the risk? The following factors may make you more likely to develop this condition:  Being male.  Being 72 or older.  Being overweight or obese.  Having had previous abdominal surgery, especially if there was an infection after surgery.  Having had an injury to the abdominal wall.  Having had several pregnancies.  Having a buildup of fluid inside the abdomen (ascites). What are the signs or symptoms? The only symptom of a ventral hernia may be a painless bulge in the abdomen. A reducible hernia may be visible only when you strain, cough, or lift. Other symptoms may include:  Dull pain.  A feeling of pressure. Signs and symptoms of a strangulated hernia may include:  Increasing pain.  Nausea and vomiting.  Pain when pressing on the hernia.  The skin over the hernia turning red or purple.  Constipation.  Blood in the stool (feces). How is this diagnosed? This condition may be diagnosed based on:  Your symptoms.  Your medical  history.  A physical exam. You may be asked to cough or strain while standing. These actions increase the pressure inside your abdomen and force the hernia through the opening in your muscles. Your health care provider may try to reduce the hernia by pressing on it.  Imaging studies, such as an ultrasound or CT scan. How is this treated? This condition is treated with surgery. If you have a strangulated hernia, surgery is done as soon as possible. If your hernia is small and not incarcerated, you may be asked to lose some weight before surgery. Follow these instructions at home:  Follow instructions from your health care provider about eating or drinking restrictions.  If you are overweight, your health care provider may recommend that you increase your activity level and eat a healthier diet.  Do not lift anything that is heavier than 10 lb (4.5 kg).  Return to your normal activities as told by your health care provider. Ask your health care provider what activities are safe for you. You may need to avoid activities that increase pressure on your hernia.  Take over-the-counter and prescription medicines only as told  by your health care provider.  Keep all follow-up visits as told by your health care provider. This is important. Contact a health care provider if:  Your hernia gets larger.  Your hernia becomes painful. Get help right away if:  Your hernia becomes increasingly painful.  You have pain along with any of the following:  Changes in skin color in the area of the hernia.  Nausea.  Vomiting.  Fever. Summary  A ventral hernia is a bulge of tissue from inside the abdomen that pushes through a weak area of the muscles that form the front wall of the abdomen.  This condition is treated with surgery, which may be urgent depending on your hernia.  Do not lift anything that is heavier than 10 lb (4.5 kg), and follow activity instructions from your health care  provider. This information is not intended to replace advice given to you by your health care provider. Make sure you discuss any questions you have with your health care provider. Document Released: 04/21/2012 Document Revised: 12/21/2015 Document Reviewed: 12/21/2015 Elsevier Interactive Patient Education  2017 [place] POST-ANESTHESIA  IMMEDIATELY FOLLOWING SURGERY:  Do not drive or operate machinery for the first twenty four hours after surgery.  Do not make any important decisions for twenty four hours after surgery or while taking narcotic pain medications or sedatives.  If you develop intractable nausea and vomiting or a severe headache please notify your doctor immediately.  FOLLOW-UP:  Please make an appointment with your surgeon as instructed. You do not need to follow up with anesthesia unless specifically instructed to do so.  WOUND CARE INSTRUCTIONS (if applicable):  Keep a dry clean dressing on the anesthesia/puncture wound site if there is drainage.  Once the wound has quit draining you may leave it open to air.  Generally you should leave the bandage intact for twenty four hours unless there is drainage.  If the epidural site drains for more than 36-48 hours please call the anesthesia department.  QUESTIONS?:  Please feel free to call your physician or the hospital operator if you have any questions, and they will be happy to assist you.

## 2016-08-11 NOTE — Anesthesia Procedure Notes (Signed)
Procedure Name: Intubation Date/Time: 08/11/2016 8:30 AM Performed by: Tressie Stalker E Pre-anesthesia Checklist: Patient identified, Patient being monitored, Timeout performed, Emergency Drugs available and Suction available Patient Re-evaluated:Patient Re-evaluated prior to inductionOxygen Delivery Method: Circle system utilized Preoxygenation: Pre-oxygenation with 100% oxygen Intubation Type: IV induction, Cricoid Pressure applied and Rapid sequence Ventilation: Mask ventilation without difficulty Laryngoscope Size: Mac and 3 Grade View: Grade I Tube type: Oral Tube size: 7.0 mm Number of attempts: 1 Airway Equipment and Method: Stylet Placement Confirmation: ETT inserted through vocal cords under direct vision,  positive ETCO2 and breath sounds checked- equal and bilateral Secured at: 21 cm Tube secured with: Tape Dental Injury: Teeth and Oropharynx as per pre-operative assessment  Comments: Modified RSI

## 2016-08-11 NOTE — Transfer of Care (Signed)
Immediate Anesthesia Transfer of Care Note  Patient: Jacob Nash  Procedure(s) Performed: Procedure(s): INCISIONAL HERNIORRHAPHY  WITH MESH (N/A)  Patient Location: PACU  Anesthesia Type:General  Level of Consciousness: awake and alert   Airway & Oxygen Therapy: Patient Spontanous Breathing and Patient connected to face mask oxygen  Post-op Assessment: Report given to RN  Post vital signs: Reviewed and stable  Last Vitals:  Vitals:   08/11/16 0815 08/11/16 0915  BP: 113/77   Resp: (!) 22   Temp:  (P) 36.6 C    Last Pain: There were no vitals filed for this visit.       Complications: No apparent anesthesia complications

## 2016-08-11 NOTE — Anesthesia Preprocedure Evaluation (Signed)
Anesthesia Evaluation  Patient identified by MRN, date of birth, ID band Patient awake    Reviewed: Allergy & Precautions, NPO status , Patient's Chart, lab work & pertinent test results  History of Anesthesia Complications (+) PONV and history of anesthetic complications  Airway Mallampati: I  TM Distance: >3 FB Neck ROM: Full    Dental  (+) Teeth Intact   Pulmonary neg pulmonary ROS,    breath sounds clear to auscultation       Cardiovascular hypertension, Pt. on medications  Rhythm:Regular Rate:Normal     Neuro/Psych PSYCHIATRIC DISORDERS Anxiety    GI/Hepatic GERD  Controlled and Medicated,  Endo/Other  diabetes, Type 2  Renal/GU      Musculoskeletal   Abdominal   Peds  Hematology   Anesthesia Other Findings   Reproductive/Obstetrics                             Anesthesia Physical Anesthesia Plan  ASA: III  Anesthesia Plan: General   Post-op Pain Management:    Induction: Intravenous, Rapid sequence and Cricoid pressure planned  Airway Management Planned: Oral ETT  Additional Equipment:   Intra-op Plan:   Post-operative Plan: Extubation in OR  Informed Consent: I have reviewed the patients History and Physical, chart, labs and discussed the procedure including the risks, benefits and alternatives for the proposed anesthesia with the patient or authorized representative who has indicated his/her understanding and acceptance.     Plan Discussed with:   Anesthesia Plan Comments:         Anesthesia Quick Evaluation

## 2016-08-11 NOTE — Anesthesia Postprocedure Evaluation (Signed)
Anesthesia Post Note  Patient: Jacob Nash  Procedure(s) Performed: Procedure(s) (LRB): INCISIONAL HERNIORRHAPHY  WITH MESH (N/A)  Patient location during evaluation: PACU Anesthesia Type: General Level of consciousness: awake and alert and oriented Pain management: pain level controlled Vital Signs Assessment: post-procedure vital signs reviewed and stable Respiratory status: spontaneous breathing Cardiovascular status: blood pressure returned to baseline Postop Assessment: no signs of nausea or vomiting Anesthetic complications: no     Last Vitals:  Vitals:   08/11/16 0935 08/11/16 0945  BP:  125/74  Pulse: 82 90  Resp: (!) 21 20  Temp:      Last Pain:  Vitals:   08/11/16 1016  PainSc: Asleep                 Likisha Alles

## 2016-08-11 NOTE — Interval H&P Note (Signed)
History and Physical Interval Note:  08/11/2016 7:28 AM  Jacob Nash  has presented today for surgery, with the diagnosis of incisional hernia  The various methods of treatment have been discussed with the patient and family. After consideration of risks, benefits and other options for treatment, the patient has consented to  Procedure(s): [place] (N/A) as a surgical intervention .  The patient's history has been reviewed, patient examined, no change in status, stable for surgery.  I have reviewed the patient's chart and labs.  Questions were answered to the patient's satisfaction.     Aviva Signs

## 2016-08-13 ENCOUNTER — Encounter (HOSPITAL_COMMUNITY): Payer: Self-pay | Admitting: General Surgery

## 2016-08-26 ENCOUNTER — Encounter: Payer: Self-pay | Admitting: General Surgery

## 2016-08-26 ENCOUNTER — Ambulatory Visit (INDEPENDENT_AMBULATORY_CARE_PROVIDER_SITE_OTHER): Payer: Self-pay | Admitting: General Surgery

## 2016-08-26 VITALS — BP 136/88 | HR 76 | Temp 98.4°F | Resp 18 | Ht 70.0 in | Wt 199.0 lb

## 2016-08-26 DIAGNOSIS — Z09 Encounter for follow-up examination after completed treatment for conditions other than malignant neoplasm: Secondary | ICD-10-CM

## 2016-08-26 NOTE — Progress Notes (Signed)
Subjective:     Jacob Nash  Status post incisional herniorrhaphy with mesh. Patient has no complaints. Objective:    BP 136/88   Pulse 76   Temp 98.4 F (36.9 C)   Resp 18   Ht 5\' 10"  (1.778 m)   Wt 199 lb (90.3 kg)   BMI 28.55 kg/m   General:  alert, cooperative and no distress  Abdomen soft. Incision healing well. Staples removed, Steri-Strips applied.     Assessment:    Doing well postoperatively.    Plan:   Increase activity as able. Follow-up as needed.

## 2016-11-07 DIAGNOSIS — N5201 Erectile dysfunction due to arterial insufficiency: Secondary | ICD-10-CM | POA: Diagnosis not present

## 2016-11-07 DIAGNOSIS — C61 Malignant neoplasm of prostate: Secondary | ICD-10-CM | POA: Diagnosis not present

## 2016-11-17 DIAGNOSIS — I10 Essential (primary) hypertension: Secondary | ICD-10-CM | POA: Diagnosis not present

## 2016-11-17 DIAGNOSIS — Z6826 Body mass index (BMI) 26.0-26.9, adult: Secondary | ICD-10-CM | POA: Diagnosis not present

## 2016-11-17 DIAGNOSIS — E1121 Type 2 diabetes mellitus with diabetic nephropathy: Secondary | ICD-10-CM | POA: Diagnosis not present

## 2016-11-17 DIAGNOSIS — F411 Generalized anxiety disorder: Secondary | ICD-10-CM | POA: Diagnosis not present

## 2016-11-17 DIAGNOSIS — Z0001 Encounter for general adult medical examination with abnormal findings: Secondary | ICD-10-CM | POA: Diagnosis not present

## 2016-11-17 DIAGNOSIS — K210 Gastro-esophageal reflux disease with esophagitis: Secondary | ICD-10-CM | POA: Diagnosis not present

## 2016-11-17 DIAGNOSIS — E782 Mixed hyperlipidemia: Secondary | ICD-10-CM | POA: Diagnosis not present

## 2016-11-17 DIAGNOSIS — E663 Overweight: Secondary | ICD-10-CM | POA: Insufficient documentation

## 2016-11-17 DIAGNOSIS — E7800 Pure hypercholesterolemia, unspecified: Secondary | ICD-10-CM | POA: Diagnosis not present

## 2016-11-18 DIAGNOSIS — L570 Actinic keratosis: Secondary | ICD-10-CM | POA: Diagnosis not present

## 2016-11-18 DIAGNOSIS — D1800 Hemangioma unspecified site: Secondary | ICD-10-CM | POA: Diagnosis not present

## 2016-11-18 DIAGNOSIS — L821 Other seborrheic keratosis: Secondary | ICD-10-CM | POA: Diagnosis not present

## 2016-11-25 DIAGNOSIS — C61 Malignant neoplasm of prostate: Secondary | ICD-10-CM | POA: Diagnosis not present

## 2016-11-25 DIAGNOSIS — J3089 Other allergic rhinitis: Secondary | ICD-10-CM | POA: Diagnosis not present

## 2016-11-25 DIAGNOSIS — K210 Gastro-esophageal reflux disease with esophagitis: Secondary | ICD-10-CM | POA: Diagnosis not present

## 2016-11-25 DIAGNOSIS — E1121 Type 2 diabetes mellitus with diabetic nephropathy: Secondary | ICD-10-CM | POA: Diagnosis not present

## 2016-11-25 DIAGNOSIS — I10 Essential (primary) hypertension: Secondary | ICD-10-CM | POA: Diagnosis not present

## 2016-11-25 DIAGNOSIS — F411 Generalized anxiety disorder: Secondary | ICD-10-CM | POA: Diagnosis not present

## 2016-11-25 DIAGNOSIS — Z6831 Body mass index (BMI) 31.0-31.9, adult: Secondary | ICD-10-CM | POA: Diagnosis not present

## 2016-11-25 DIAGNOSIS — E782 Mixed hyperlipidemia: Secondary | ICD-10-CM | POA: Diagnosis not present

## 2017-01-25 DIAGNOSIS — Z23 Encounter for immunization: Secondary | ICD-10-CM | POA: Diagnosis not present

## 2017-02-04 DIAGNOSIS — K5732 Diverticulitis of large intestine without perforation or abscess without bleeding: Secondary | ICD-10-CM | POA: Insufficient documentation

## 2017-02-04 DIAGNOSIS — Z6825 Body mass index (BMI) 25.0-25.9, adult: Secondary | ICD-10-CM | POA: Diagnosis not present

## 2017-02-04 DIAGNOSIS — R1032 Left lower quadrant pain: Secondary | ICD-10-CM | POA: Diagnosis not present

## 2017-03-24 DIAGNOSIS — M1711 Unilateral primary osteoarthritis, right knee: Secondary | ICD-10-CM | POA: Diagnosis not present

## 2017-03-24 DIAGNOSIS — M545 Low back pain: Secondary | ICD-10-CM | POA: Diagnosis not present

## 2017-04-01 DIAGNOSIS — H524 Presbyopia: Secondary | ICD-10-CM | POA: Diagnosis not present

## 2017-04-01 DIAGNOSIS — H5203 Hypermetropia, bilateral: Secondary | ICD-10-CM | POA: Diagnosis not present

## 2017-04-01 DIAGNOSIS — H52221 Regular astigmatism, right eye: Secondary | ICD-10-CM | POA: Diagnosis not present

## 2017-04-01 DIAGNOSIS — E119 Type 2 diabetes mellitus without complications: Secondary | ICD-10-CM | POA: Diagnosis not present

## 2017-04-14 DIAGNOSIS — K5732 Diverticulitis of large intestine without perforation or abscess without bleeding: Secondary | ICD-10-CM | POA: Diagnosis not present

## 2017-04-14 DIAGNOSIS — R1030 Lower abdominal pain, unspecified: Secondary | ICD-10-CM | POA: Diagnosis not present

## 2017-04-14 DIAGNOSIS — Z6826 Body mass index (BMI) 26.0-26.9, adult: Secondary | ICD-10-CM | POA: Diagnosis not present

## 2017-04-21 DIAGNOSIS — Z6826 Body mass index (BMI) 26.0-26.9, adult: Secondary | ICD-10-CM | POA: Diagnosis not present

## 2017-04-21 DIAGNOSIS — Z5189 Encounter for other specified aftercare: Secondary | ICD-10-CM | POA: Insufficient documentation

## 2017-04-21 DIAGNOSIS — S76211D Strain of adductor muscle, fascia and tendon of right thigh, subsequent encounter: Secondary | ICD-10-CM | POA: Diagnosis not present

## 2017-05-07 DIAGNOSIS — Z6827 Body mass index (BMI) 27.0-27.9, adult: Secondary | ICD-10-CM | POA: Diagnosis not present

## 2017-05-07 DIAGNOSIS — N509 Disorder of male genital organs, unspecified: Secondary | ICD-10-CM | POA: Insufficient documentation

## 2017-05-07 DIAGNOSIS — N451 Epididymitis: Secondary | ICD-10-CM | POA: Diagnosis not present

## 2017-06-01 ENCOUNTER — Encounter: Payer: Self-pay | Admitting: Internal Medicine

## 2017-06-01 DIAGNOSIS — F411 Generalized anxiety disorder: Secondary | ICD-10-CM | POA: Diagnosis not present

## 2017-06-01 DIAGNOSIS — E1121 Type 2 diabetes mellitus with diabetic nephropathy: Secondary | ICD-10-CM | POA: Diagnosis not present

## 2017-06-01 DIAGNOSIS — E782 Mixed hyperlipidemia: Secondary | ICD-10-CM | POA: Diagnosis not present

## 2017-06-01 DIAGNOSIS — E7800 Pure hypercholesterolemia, unspecified: Secondary | ICD-10-CM | POA: Diagnosis not present

## 2017-06-01 DIAGNOSIS — K210 Gastro-esophageal reflux disease with esophagitis: Secondary | ICD-10-CM | POA: Diagnosis not present

## 2017-06-01 DIAGNOSIS — I10 Essential (primary) hypertension: Secondary | ICD-10-CM | POA: Diagnosis not present

## 2017-06-04 ENCOUNTER — Telehealth: Payer: Self-pay | Admitting: Internal Medicine

## 2017-06-04 ENCOUNTER — Ambulatory Visit (INDEPENDENT_AMBULATORY_CARE_PROVIDER_SITE_OTHER): Payer: Medicare Other | Admitting: Gastroenterology

## 2017-06-04 ENCOUNTER — Encounter: Payer: Self-pay | Admitting: Gastroenterology

## 2017-06-04 ENCOUNTER — Encounter: Payer: Self-pay | Admitting: *Deleted

## 2017-06-04 DIAGNOSIS — R140 Abdominal distension (gaseous): Secondary | ICD-10-CM

## 2017-06-04 DIAGNOSIS — R1010 Upper abdominal pain, unspecified: Secondary | ICD-10-CM | POA: Diagnosis not present

## 2017-06-04 DIAGNOSIS — K5900 Constipation, unspecified: Secondary | ICD-10-CM | POA: Diagnosis not present

## 2017-06-04 DIAGNOSIS — R1031 Right lower quadrant pain: Secondary | ICD-10-CM

## 2017-06-04 DIAGNOSIS — Z86010 Personal history of colonic polyps: Secondary | ICD-10-CM

## 2017-06-04 NOTE — Progress Notes (Signed)
Primary Care Physician:  Manon Hilding, MD  Primary Gastroenterologist:  Garfield Cornea, MD   Chief Complaint  Patient presents with  . Abdominal Pain  . Constipation    HPI:  Jacob Nash is a 72 y.o. male here for further evaluation of abdominal pain and constipation at the request of Dr. Quintin Alto.   Patient complains of chronic constipation. For years has consumed prune juice every am and up until two months ago it worked well to move bowels. More difficult to manage constipation now. Added miralax at bedtime. Still not having great results. Complains of right lower quadrant and groin pain. Feels raw and burning in quality. Worse with meals. H/o left inguinal hernia repair in 2010, incisional hernia (low mid-line) repair 07/2016.patient complains of nausea without vomiting.upper abd pain as well.  Ranitidine now, had been on omeprazole but due to expense he switched. Doesn't work as well. No melena, brbpr. Per patient urine specimen ok. Seeing urologist for f/u soon. No nsaids/asa. Feels bloated and swollen.  PCP gave cipro/flagyl for ?diverticulitis. Also given doxycycline for ?epididymitis. Neither helped his abdominal pain.    Last TCS 06/24/12: due every five years. Dr. Earlean Shawl. Remote EGD, [facility].  Current Outpatient Medications  Medication Sig Dispense Refill  . acetaminophen (TYLENOL) 500 MG tablet Take 500-1,000 mg by mouth every 6 (six) hours as needed (for pain/headaches.).     Marland Kitchen ALPRAZolam (XANAX) 0.5 MG tablet Take 0.5 mg by mouth 2 (two) times daily. Mid-afternoon & 1 hour before bedtime.  5  . aspirin EC 81 MG tablet Take 81 mg by mouth daily after supper.    . Cholecalciferol (VITAMIN D) 2000 units tablet Take 2,000 Units by mouth daily after lunch.    . fluticasone (FLONASE) 50 MCG/ACT nasal spray Place 2 sprays into both nostrils daily.   10  . lisinopril (PRINIVIL,ZESTRIL) 40 MG tablet Take 40 mg by mouth daily after lunch.   12  . loratadine (CLARITIN) 10 MG tablet Take 10  mg by mouth daily after lunch.     . Omega-3 Fatty Acids (FISH OIL) 1200 MG CAPS Take 2,400 mg by mouth daily after lunch.    . ranitidine (ZANTAC) 150 MG tablet Take 150 mg by mouth 2 (two) times daily.  12  . simvastatin (ZOCOR) 20 MG tablet Take 10 mg by mouth daily after supper.   12  . triamcinolone cream (KENALOG) 0.1 % Apply 1 application topically 2 (two) times daily as needed. For dry skin patches/itchy skin.     No current facility-administered medications for this visit.     Allergies as of 06/04/2017  . (No Known Allergies)    Past Medical History:  Diagnosis Date  . Anxiety   . Arthritis   . Cancer [facility]) MARCH 2017   PROSTATE   . Diabetes mellitus without complication ([place])   . Elevated cholesterol   . Frequent urination   . GERD (gastroesophageal reflux disease)   . Hemorrhoids   . Hypertension   . Joint pain   . PONV (postoperative nausea and vomiting)    NAUSEA ONLY    Past Surgical History:  Procedure Laterality Date  . [place] AND 1996   LOWER BACK  . HERNIA REPAIR  03-21-2009    LEFT INGUINAL  . INCISIONAL HERNIA REPAIR N/A 08/11/2016   Procedure: Fatima Blank HERNIORRHAPHY  WITH MESH;  Surgeon: Aviva Signs, MD;  Location: [facility];  Service: General;  Laterality: N/A;  . LYMPHADENECTOMY Bilateral 10/11/2015  Procedure: BILATERAL PELVIC LYMPHADENECTOMY;  Surgeon: Raynelle Bring, MD;  Location: [facility];  Service: Urology;  Laterality: Bilateral;  . PROSTATE BIOPSY  07-2015  . ROBOT ASSISTED LAPAROSCOPIC RADICAL PROSTATECTOMY N/A 10/11/2015   Procedure: XI ROBOTIC ASSISTED LAPAROSCOPIC RADICAL PROSTATECTOMY LEVEL 2;  Surgeon: Raynelle Bring, MD;  Location: [facility];  Service: Urology;  Laterality: N/A;  . SHOULDER SURGERY Right 06-06-14   MUSCLE TEAR    History reviewed. No pertinent family history.  Social History   Socioeconomic History  . Marital status: Married    Spouse name: Not on file  . Number of children: Not on file  . Years of  education: Not on file  . Highest education level: Not on file  Social Needs  . Financial resource strain: Not on file  . Food insecurity - worry: Not on file  . Food insecurity - inability: Not on file  . Transportation needs - medical: Not on file  . Transportation needs - non-medical: Not on file  Occupational History  . Not on file  Tobacco Use  . Smoking status: Never Smoker  . Smokeless tobacco: Never Used  Substance and Sexual Activity  . Alcohol use: No  . Drug use: No  . Sexual activity: Yes    Birth control/protection: None  Other Topics Concern  . Not on file  Social History Narrative  . Not on file      ROS:  General: Negative for anorexia, unintentional weight loss, fever, chills, fatigue, weakness. Eyes: Negative for vision changes.  ENT: Negative for hoarseness, difficulty swallowing , nasal congestion. CV: Negative for chest pain, angina, palpitations, dyspnea on exertion, peripheral edema.  Respiratory: Negative for dyspnea at rest, dyspnea on exertion, cough, sputum, wheezing.  GI: See history of present illness. GU:  Negative for dysuria, hematuria, urinary incontinence, urinary frequency, nocturnal urination.  MS: Negative for joint pain, low back pain.  Derm: Negative for rash or itching.  Neuro: Negative for weakness, abnormal sensation, seizure, frequent headaches, memory loss, confusion.  Psych: Negative for anxiety, depression, suicidal ideation, hallucinations.  Endo: Negative for unusual weight change.  Heme: Negative for bruising or bleeding. Allergy: Negative for rash or hives.    Physical Examination:  BP (!) 150/97   Pulse 84   Temp (!) 97.3 F (36.3 C) (Oral)   Ht 5' 10.5" (1.791 m)   Wt 196 lb 12.8 oz (89.3 kg)   BMI 27.84 kg/m    General: Well-nourished, well-developed in no acute distress.  Head: Normocephalic, atraumatic.   Eyes: Conjunctiva pink, no icterus. Mouth: Oropharyngeal mucosa moist and pink , no lesions erythema  or exudate. Neck: Supple without thyromegaly, masses, or lymphadenopathy.  Lungs: Clear to auscultation bilaterally.  Heart: Regular rate and rhythm, no murmurs rubs or gallops.  Abdomen: Bowel sounds are normal, , nondistended, moderate upper and RLQ tenderness, nohepatosplenomegaly or masses, no abdominal bruits or    hernia , no rebound or guarding.   Rectal: not performed Extremities: No lower extremity edema. No clubbing or deformities.  Neuro: Alert and oriented x 4 , grossly normal neurologically.  Skin: Warm and dry, no rash or jaundice.   Psych: Alert and cooperative, normal mood and affect.    Imaging Studies: No results found.   Impression/Plan:  72 y/o male with couple month history of RLQ pain, constipation (change in bowel habits), upper abd pain, GERD. H/o colonic polyps due for surveillance colonoscopy. Patient treated empirically for diverticulitis. abx for epididymitis. Neither helped his abd pain. Constipation poorly  managed with prune juice and miralax. Significant tenderness on exam in both rlq and upper abdomen. No NSAIDS/ASA.   Would be concerned about complicated diverticulitis, cannot exclude appendicitis, less likely related to prostate cancer.  Given h/o multiple abd surgeries cannot rule out adhesions.  Prior to pursuing colonoscopy, would recommend CT abd/pelvis. Will increase miralax to bid in interim but if CT ok, could consider Linzess or Amitiza.

## 2017-06-04 NOTE — Telephone Encounter (Signed)
Pt called to say that he needed his lab order faxed to the lab.

## 2017-06-04 NOTE — Telephone Encounter (Signed)
Spoke with pts spouse, they were at the lab when I received a call from the lab tech. Pt is to have labs done the morning of his procedure, not today. Tech was going to let the pt know he could go home.

## 2017-06-04 NOTE — Patient Instructions (Signed)
1. Continue miralax at twice daily until regular bowel movement and then go back to once daily.  2. Continue prune juice.  3. CT scan of abdomen as discussed. If OK, then next step is colonoscopy.

## 2017-06-05 ENCOUNTER — Ambulatory Visit (HOSPITAL_COMMUNITY)
Admission: RE | Admit: 2017-06-05 | Discharge: 2017-06-05 | Disposition: A | Discharge status: Home / Self Care | Payer: Medicare Other | Source: Ambulatory Visit | Attending: Gastroenterology | Admitting: Gastroenterology

## 2017-06-05 DIAGNOSIS — R1031 Right lower quadrant pain: Secondary | ICD-10-CM | POA: Insufficient documentation

## 2017-06-05 DIAGNOSIS — R1010 Upper abdominal pain, unspecified: Secondary | ICD-10-CM | POA: Insufficient documentation

## 2017-06-05 DIAGNOSIS — K8020 Calculus of gallbladder without cholecystitis without obstruction: Secondary | ICD-10-CM | POA: Insufficient documentation

## 2017-06-05 DIAGNOSIS — R140 Abdominal distension (gaseous): Secondary | ICD-10-CM | POA: Diagnosis not present

## 2017-06-05 DIAGNOSIS — M899 Disorder of bone, unspecified: Secondary | ICD-10-CM | POA: Diagnosis not present

## 2017-06-05 DIAGNOSIS — K5900 Constipation, unspecified: Secondary | ICD-10-CM | POA: Insufficient documentation

## 2017-06-05 IMAGING — CT CT ABD-PELV W/ CM
2 of 5 series · 16 of 46 positions shown, 18 images · IV contrast (iopamidol)
Comparison: CT pelvis [DATE]

CLINICAL DATA: Abdominal distension, constipation

EXAM:
CT ABDOMEN AND PELVIS WITH CONTRAST
TECHNIQUE: Multidetector CT imaging of the abdomen and pelvis was performed
using the standard protocol following bolus administration of
intravenous contrast.
CONTRAST:  100mL [7E] IOPAMIDOL ([7E]) INJECTION 61%

[Series 2: axial st · axial · 0.77mm/px · z∈[+906,+1342]mm · 13 of 99 slices shown, 15 images]
[im 6/99  soft-tissue]
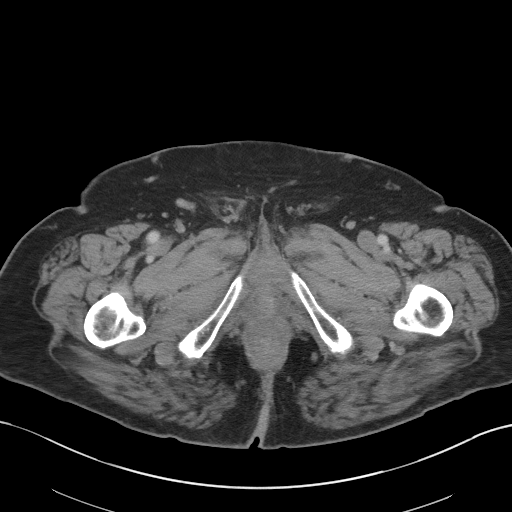
[im 6/99  bone]
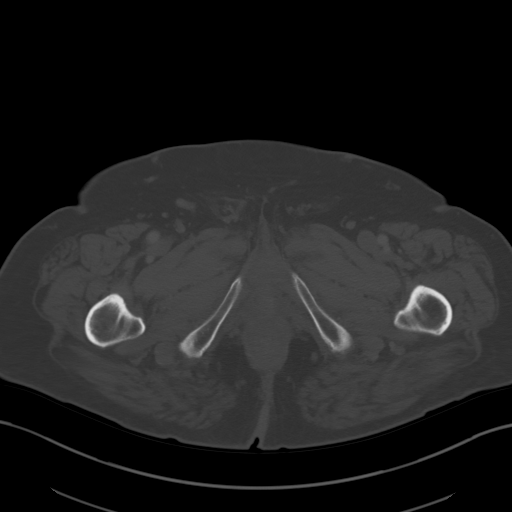
[im 16/99  soft-tissue]
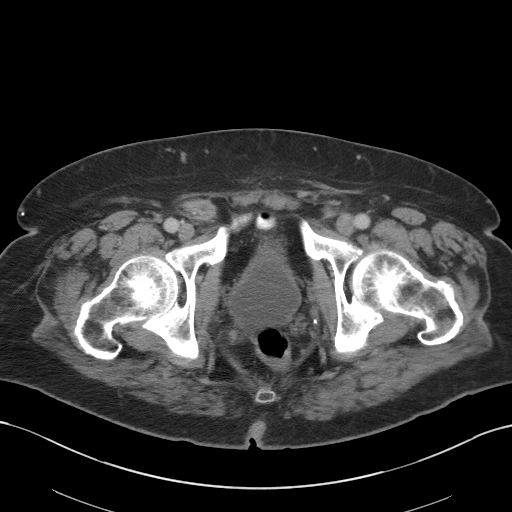
[im 21/99  soft-tissue]
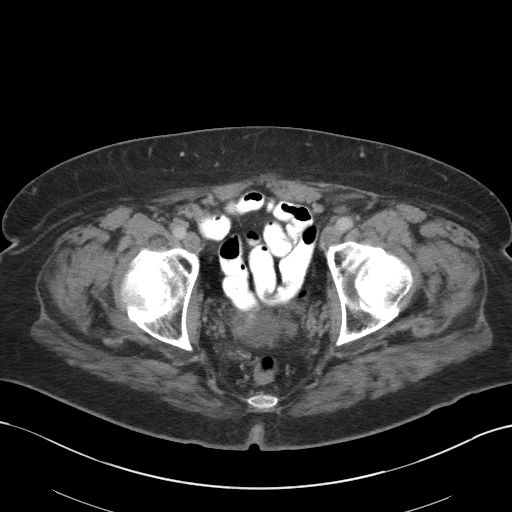
[im 26/99  soft-tissue]
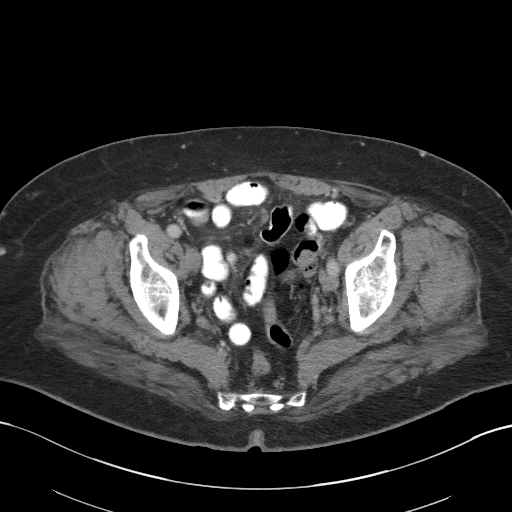
[im 37/99  soft-tissue]
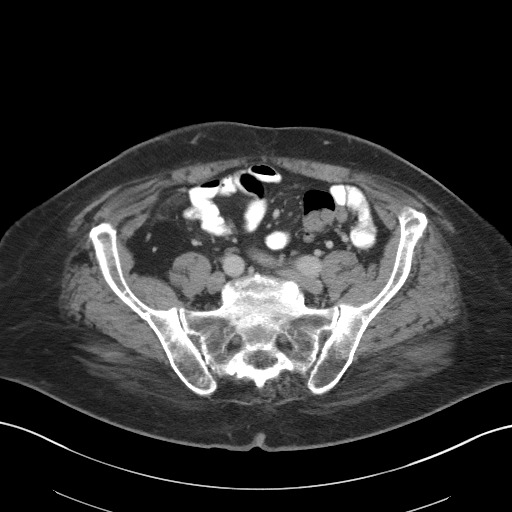
[im 42/99  soft-tissue]
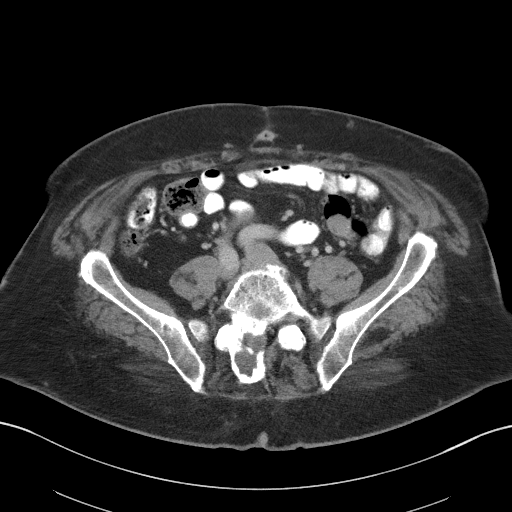
[im 52/99  soft-tissue]
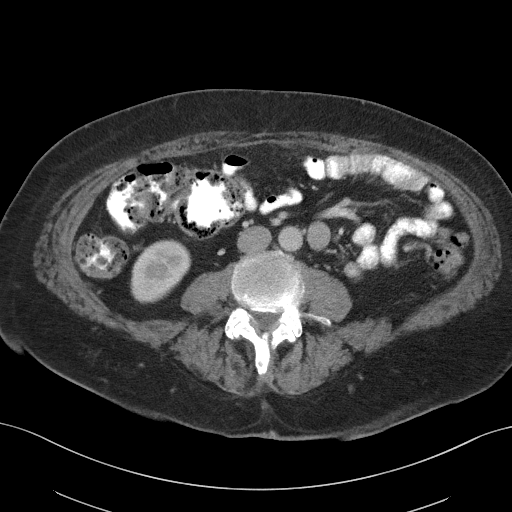
[im 57/99  soft-tissue]
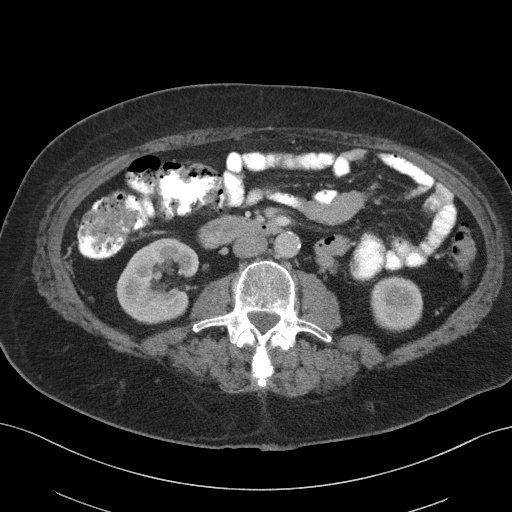
[im 62/99  soft-tissue]
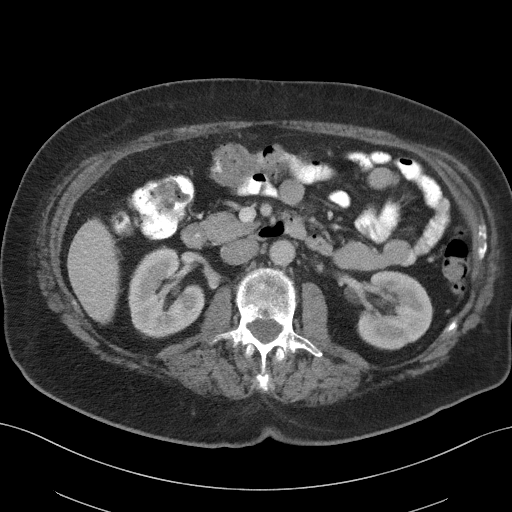
[im 62/99  bone]
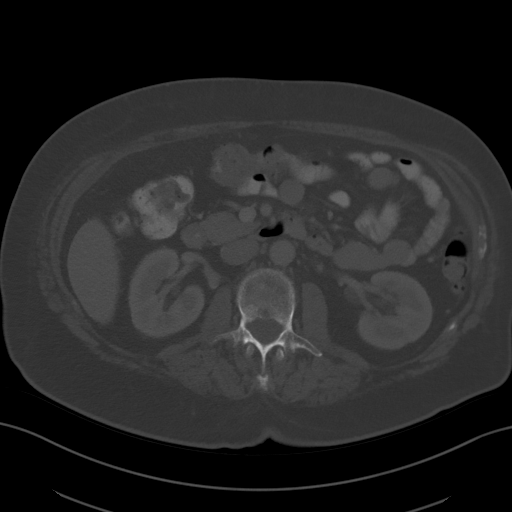
[im 73/99  soft-tissue]
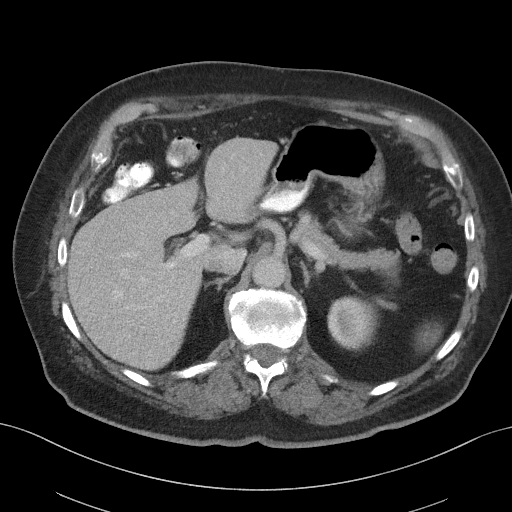
[im 78/99  soft-tissue]
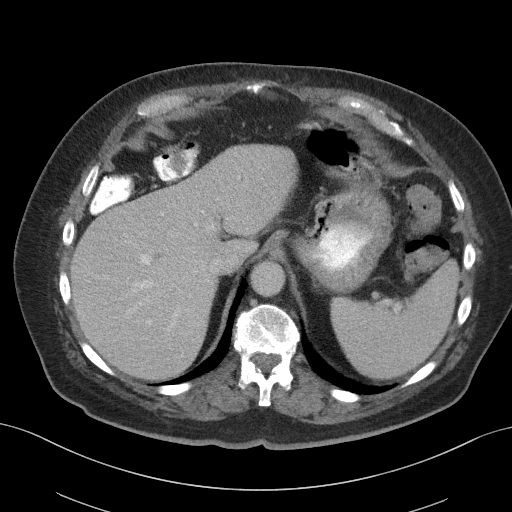
[im 83/99  soft-tissue]
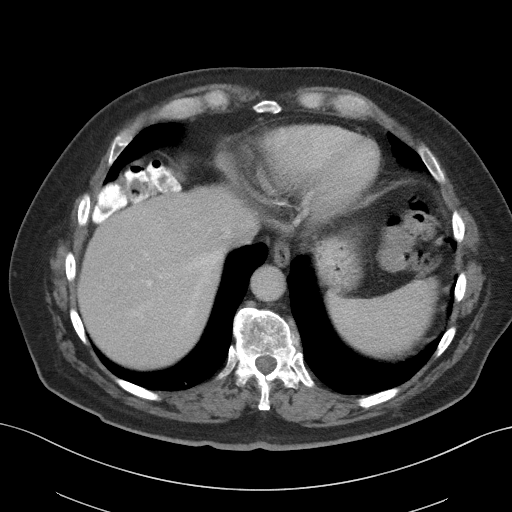
[im 93/99  soft-tissue]
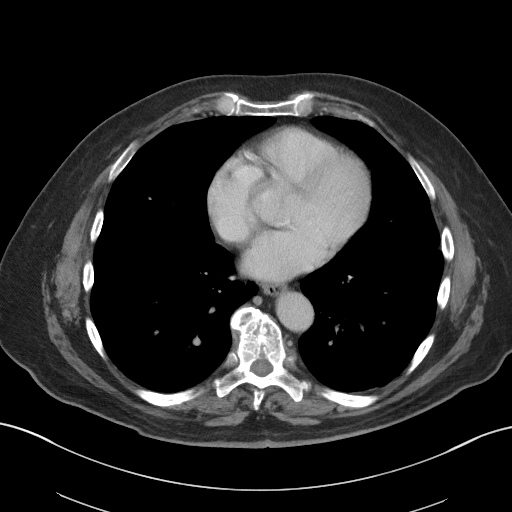

[Series 5: coronal st · coronal · 0.86mm/px · 3 of 91 slices shown]
[im 31/91  soft-tissue]
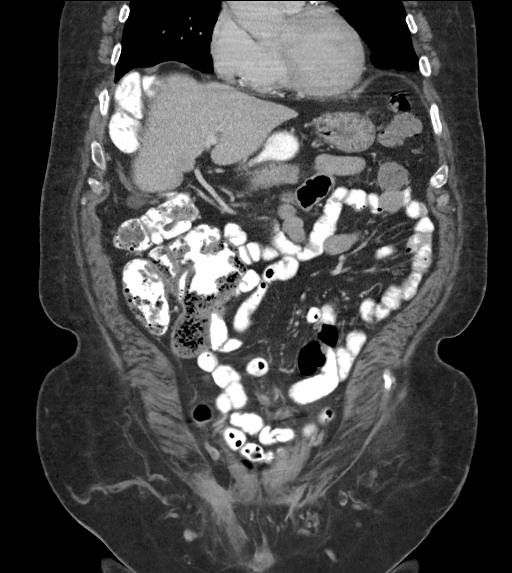
[im 41/91  soft-tissue]
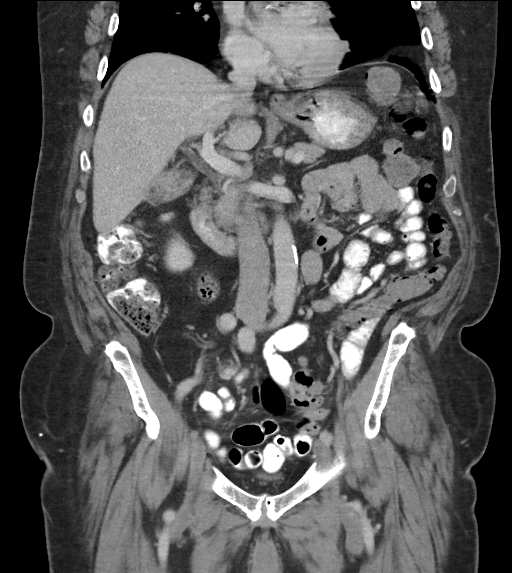
[im 51/91  soft-tissue]
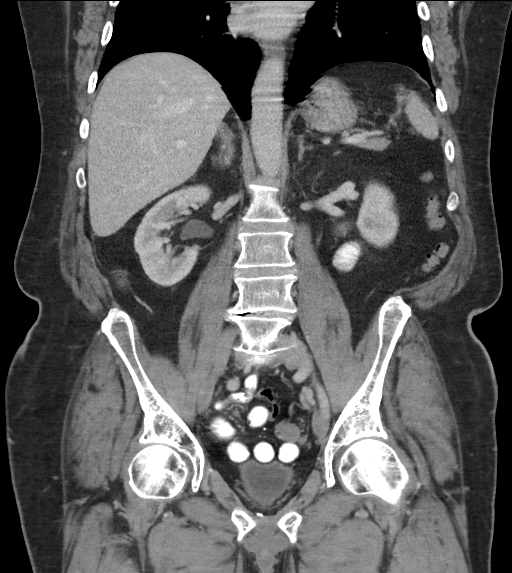

[16 of 46 positions shown; findings below may reference images not displayed]

FINDINGS: Lower chest: No acute abnormality. Coronary artery atherosclerosis
in the left main, lad, first diagonal branch and RCA.

Hepatobiliary: No focal hepatic mass. No intrahepatic or
extrahepatic biliary ductal dilatation. Cholelithiasis. No
gallbladder wall thickening or pericholecystic inflammatory changes.

Pancreas: Unremarkable. No pancreatic ductal dilatation or
surrounding inflammatory changes.

Spleen: Normal in size without focal abnormality.

Adrenals/Urinary Tract: Adrenal glands are unremarkable. Kidneys are
normal, without renal calculi, focal lesion, or hydronephrosis.
Bladder is unremarkable.

Stomach/Bowel: No bowel wall thickening or bowel dilatation. No
pneumatosis, pneumoperitoneum or portal venous gas. Moderate amount
of stool in the ascending, transverse and to lesser extent
descending colon. No abdominal or pelvic free fluid. Diverticulosis
without evidence of diverticulitis.

Vascular/Lymphatic: Abdominal aortic atherosclerosis. Normal caliber
abdominal aorta. No lymphadenopathy.

Reproductive: Prior prostatectomy.

Other: No abdominal wall hernia or abnormality. No abdominopelvic
ascites.

Musculoskeletal: No acute osseous abnormality. No aggressive osseous
lesion. Degenerative disc disease with disc height loss at L4-5 and
L5-S1. 3 mm retrolisthesis of L3 on L4. Diffuse facet arthropathy
throughout the lumbar spine. Expansile lytic bone lesion involving
the right L5 lamina unchanged compared with [DATE] likely
reflecting fibrous dysplasia versus unicameral or aneurysmal bone
cyst.
IMPRESSION: 1. No acute abdominal or pelvic pathology.
2. Cholelithiasis.
3. Moderate amount of stool in the ascending and transverse colon.

## 2017-06-05 MED ORDER — IOPAMIDOL (ISOVUE-300) INJECTION 61%
100.0000 mL | Freq: Once | INTRAVENOUS | Status: AC | PRN
  Administered 2017-06-05: 100 mL via INTRAVENOUS

## 2017-06-06 NOTE — Progress Notes (Signed)
Please let patient know his CT showed no evidence of diverticulitis. He has evidence of constipation but no blockages. Gallstones but no acute gallbladder inflammation.   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 Not sure that anything else needs to be done but would advise he follow up with his PCP. Send copy of CT to PCP.   For constipation, would offer Linzess 163mcg daily, #30, 5 refills instead of Miralax.  Schedule TCS with RMR. Dx: h/o colon polyps, change in bowels.

## 2017-06-06 NOTE — H&P (View-Only) (Signed)
Please let patient know his CT showed no evidence of diverticulitis. He has evidence of constipation but no blockages. Gallstones but no acute gallbladder inflammation.   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 Not sure that anything else needs to be done but would advise he follow up with his PCP. Send copy of CT to PCP.   For constipation, would offer Linzess 164mcg daily, #30, 5 refills instead of Miralax.  Schedule TCS with RMR. Dx: h/o colon polyps, change in bowels.

## 2017-06-08 DIAGNOSIS — C61 Malignant neoplasm of prostate: Secondary | ICD-10-CM | POA: Diagnosis not present

## 2017-06-08 DIAGNOSIS — I10 Essential (primary) hypertension: Secondary | ICD-10-CM | POA: Diagnosis not present

## 2017-06-08 DIAGNOSIS — E1121 Type 2 diabetes mellitus with diabetic nephropathy: Secondary | ICD-10-CM | POA: Diagnosis not present

## 2017-06-08 DIAGNOSIS — Z6831 Body mass index (BMI) 31.0-31.9, adult: Secondary | ICD-10-CM | POA: Diagnosis not present

## 2017-06-08 DIAGNOSIS — K210 Gastro-esophageal reflux disease with esophagitis: Secondary | ICD-10-CM | POA: Diagnosis not present

## 2017-06-08 DIAGNOSIS — K5909 Other constipation: Secondary | ICD-10-CM | POA: Diagnosis not present

## 2017-06-08 DIAGNOSIS — E782 Mixed hyperlipidemia: Secondary | ICD-10-CM | POA: Diagnosis not present

## 2017-06-08 DIAGNOSIS — J3089 Other allergic rhinitis: Secondary | ICD-10-CM | POA: Diagnosis not present

## 2017-06-08 NOTE — Progress Notes (Signed)
CC'D TO PCP °

## 2017-06-10 ENCOUNTER — Other Ambulatory Visit: Payer: Self-pay

## 2017-06-10 ENCOUNTER — Telehealth: Payer: Self-pay | Admitting: Internal Medicine

## 2017-06-10 DIAGNOSIS — R194 Change in bowel habit: Secondary | ICD-10-CM

## 2017-06-10 DIAGNOSIS — K5900 Constipation, unspecified: Secondary | ICD-10-CM

## 2017-06-10 DIAGNOSIS — Z86010 Personal history of colonic polyps: Secondary | ICD-10-CM

## 2017-06-10 MED ORDER — LINACLOTIDE 145 MCG PO CAPS: 145.0000 ug | ORAL_CAPSULE | Freq: Every day | ORAL | Status: DC

## 2017-06-10 MED ORDER — NA SULFATE-K SULFATE-MG SULF 17.5-3.13-1.6 GM/177ML PO SOLN: 1.0000 | ORAL | 0 refills | Status: DC

## 2017-06-10 NOTE — Telephone Encounter (Signed)
Pt said his pharmacy hasn't received his LInzess prescription yet and was calling to see if that would be sent today. Please advise. [phone]

## 2017-06-10 NOTE — Telephone Encounter (Signed)
Spoke with pt, VM was left with Linzess 178mcg take one cap 30 min before breakfast.

## 2017-06-12 DIAGNOSIS — N5201 Erectile dysfunction due to arterial insufficiency: Secondary | ICD-10-CM | POA: Diagnosis not present

## 2017-06-12 DIAGNOSIS — C61 Malignant neoplasm of prostate: Secondary | ICD-10-CM | POA: Diagnosis not present

## 2017-06-16 ENCOUNTER — Other Ambulatory Visit: Payer: Self-pay | Admitting: *Deleted

## 2017-06-16 DIAGNOSIS — R194 Change in bowel habit: Secondary | ICD-10-CM

## 2017-06-16 DIAGNOSIS — Z86010 Personal history of colonic polyps: Secondary | ICD-10-CM

## 2017-06-16 DIAGNOSIS — R1010 Upper abdominal pain, unspecified: Secondary | ICD-10-CM

## 2017-06-16 DIAGNOSIS — R1013 Epigastric pain: Secondary | ICD-10-CM

## 2017-06-16 NOTE — Progress Notes (Signed)
Personally spoke to patient, discussed all results. He is going to discuss L5 lesion with his orthopedist.   He notes that he has continued epigastric pain and RUQ pain. Linzess is helping his constipation, better first two days, limited results yesterday. He can take with food or double up if needed at times.   PLEASE ADD ON EGD WITH COLONOSCOPY THAT IS ALREADY SCHEDULED (EPIG AND RUQ PAIN).

## 2017-07-03 ENCOUNTER — Encounter (HOSPITAL_COMMUNITY): Payer: Self-pay | Admitting: *Deleted

## 2017-07-03 ENCOUNTER — Encounter (HOSPITAL_COMMUNITY)
Admission: RE | Disposition: A | Discharge status: Home / Self Care | Payer: Self-pay | Source: Ambulatory Visit | Attending: Internal Medicine

## 2017-07-03 ENCOUNTER — Ambulatory Visit (HOSPITAL_COMMUNITY)
Admission: RE | Admit: 2017-07-03 | Discharge: 2017-07-03 | Disposition: A | Discharge status: Home / Self Care | Payer: Medicare Other | Source: Ambulatory Visit | Attending: Internal Medicine | Admitting: Internal Medicine

## 2017-07-03 ENCOUNTER — Other Ambulatory Visit: Payer: Self-pay

## 2017-07-03 DIAGNOSIS — Z8546 Personal history of malignant neoplasm of prostate: Secondary | ICD-10-CM | POA: Insufficient documentation

## 2017-07-03 DIAGNOSIS — R1013 Epigastric pain: Secondary | ICD-10-CM

## 2017-07-03 DIAGNOSIS — F419 Anxiety disorder, unspecified: Secondary | ICD-10-CM | POA: Diagnosis not present

## 2017-07-03 DIAGNOSIS — I10 Essential (primary) hypertension: Secondary | ICD-10-CM | POA: Insufficient documentation

## 2017-07-03 DIAGNOSIS — R12 Heartburn: Secondary | ICD-10-CM | POA: Diagnosis not present

## 2017-07-03 DIAGNOSIS — K5730 Diverticulosis of large intestine without perforation or abscess without bleeding: Secondary | ICD-10-CM | POA: Insufficient documentation

## 2017-07-03 DIAGNOSIS — E7800 Pure hypercholesterolemia, unspecified: Secondary | ICD-10-CM | POA: Diagnosis not present

## 2017-07-03 DIAGNOSIS — E119 Type 2 diabetes mellitus without complications: Secondary | ICD-10-CM | POA: Diagnosis not present

## 2017-07-03 DIAGNOSIS — Z7982 Long term (current) use of aspirin: Secondary | ICD-10-CM | POA: Insufficient documentation

## 2017-07-03 DIAGNOSIS — Z79899 Other long term (current) drug therapy: Secondary | ICD-10-CM | POA: Insufficient documentation

## 2017-07-03 DIAGNOSIS — R1011 Right upper quadrant pain: Secondary | ICD-10-CM

## 2017-07-03 DIAGNOSIS — Z1211 Encounter for screening for malignant neoplasm of colon: Secondary | ICD-10-CM | POA: Insufficient documentation

## 2017-07-03 DIAGNOSIS — K449 Diaphragmatic hernia without obstruction or gangrene: Secondary | ICD-10-CM | POA: Diagnosis not present

## 2017-07-03 DIAGNOSIS — Z86010 Personal history of colonic polyps: Secondary | ICD-10-CM | POA: Insufficient documentation

## 2017-07-03 DIAGNOSIS — R1010 Upper abdominal pain, unspecified: Secondary | ICD-10-CM

## 2017-07-03 DIAGNOSIS — R194 Change in bowel habit: Secondary | ICD-10-CM

## 2017-07-03 DIAGNOSIS — K219 Gastro-esophageal reflux disease without esophagitis: Secondary | ICD-10-CM | POA: Diagnosis not present

## 2017-07-03 HISTORY — PX: ESOPHAGOGASTRODUODENOSCOPY: SHX5428

## 2017-07-03 HISTORY — PX: COLONOSCOPY: SHX5424

## 2017-07-03 SURGERY — COLONOSCOPY
Anesthesia: Moderate Sedation

## 2017-07-03 MED ORDER — MEPERIDINE HCL 100 MG/ML IJ SOLN
INTRAMUSCULAR | Status: DC | PRN
  Administered 2017-07-03: 50 mg via INTRAVENOUS
  Administered 2017-07-03: 25 mg via INTRAVENOUS
  Administered 2017-07-03: 50 mg via INTRAVENOUS
  Administered 2017-07-03: 25 mg via INTRAVENOUS

## 2017-07-03 MED ORDER — ONDANSETRON HCL 4 MG/2ML IJ SOLN
INTRAMUSCULAR | Status: DC | PRN
  Administered 2017-07-03: 4 mg via INTRAVENOUS

## 2017-07-03 MED ORDER — LIDOCAINE VISCOUS 2 % MT SOLN
OROMUCOSAL | Status: AC
  Filled 2017-07-03: qty 15

## 2017-07-03 MED ORDER — SODIUM CHLORIDE 0.9 % IV SOLN
INTRAVENOUS | Status: DC
  Administered 2017-07-03: 1000 mL via INTRAVENOUS

## 2017-07-03 MED ORDER — MIDAZOLAM HCL 5 MG/5ML IJ SOLN
INTRAMUSCULAR | Status: AC
Start: 2017-07-03 — End: 2017-07-04
  Filled 2017-07-03: qty 10

## 2017-07-03 MED ORDER — MIDAZOLAM HCL 5 MG/5ML IJ SOLN
INTRAMUSCULAR | Status: DC | PRN
  Administered 2017-07-03 (×2): 2 mg via INTRAVENOUS
  Administered 2017-07-03: 1 mg via INTRAVENOUS
  Administered 2017-07-03 (×2): 2 mg via INTRAVENOUS

## 2017-07-03 MED ORDER — STERILE WATER FOR IRRIGATION IR SOLN
Status: DC | PRN
  Administered 2017-07-03: 2.5 mL

## 2017-07-03 MED ORDER — MEPERIDINE HCL 100 MG/ML IJ SOLN
INTRAMUSCULAR | Status: AC
  Filled 2017-07-03: qty 2

## 2017-07-03 MED ORDER — ONDANSETRON HCL 4 MG/2ML IJ SOLN
INTRAMUSCULAR | Status: AC
  Filled 2017-07-03: qty 2

## 2017-07-03 NOTE — Discharge Instructions (Signed)
°Colonoscopy °Discharge Instructions ° °Read the instructions outlined below and refer to this sheet in the next few weeks. These discharge instructions provide you with general information on caring for yourself after you leave the hospital. Your doctor may also give you specific instructions. While your treatment has been planned according to the most current medical practices available, unavoidable complications occasionally occur. If you have any problems or questions after discharge, call Dr. Rourk at [phone]. °ACTIVITY °· You may resume your regular activity, but move at a slower pace for the next 24 hours.  °· Take frequent rest periods for the next 24 hours.  °· Walking will help get rid of the air and reduce the bloated feeling in your belly (abdomen).  °· No driving for 24 hours (because of the medicine (anesthesia) used during the test).   °· Do not sign any important legal documents or operate any machinery for 24 hours (because of the anesthesia used during the test).  °NUTRITION °· Drink plenty of fluids.  °· You may resume your normal diet as instructed by your doctor.  °· Begin with a light meal and progress to your normal diet. Heavy or fried foods are harder to digest and may make you feel sick to your stomach (nauseated).  °· Avoid alcoholic beverages for 24 hours or as instructed.  °MEDICATIONS °· You may resume your normal medications unless your doctor tells you otherwise.  °WHAT YOU CAN EXPECT TODAY °· Some feelings of bloating in the abdomen.  °· Passage of more gas than usual.  °· Spotting of blood in your stool or on the toilet paper.  °IF YOU HAD POLYPS REMOVED DURING THE COLONOSCOPY: °· No aspirin products for 7 days or as instructed.  °· No alcohol for 7 days or as instructed.  °· Eat a soft diet for the next 24 hours.  °FINDING OUT THE RESULTS OF YOUR TEST °Not all test results are available during your visit. If your test results are not back during the visit, make an appointment  with your caregiver to find out the results. Do not assume everything is normal if you have not heard from your caregiver or the medical facility. It is important for you to follow up on all of your test results.  °SEEK IMMEDIATE MEDICAL ATTENTION IF: °· You have more than a spotting of blood in your stool.  °· Your belly is swollen (abdominal distention).  °· You are nauseated or vomiting.  °· You have a temperature over 101.  °· You have abdominal pain or discomfort that is severe or gets worse throughout the day.  °EGD °Discharge instructions °Please read the instructions outlined below and refer to this sheet in the next few weeks. These discharge instructions provide you with general information on caring for yourself after you leave the hospital. Your doctor may also give you specific instructions. While your treatment has been planned according to the most current medical practices available, unavoidable complications occasionally occur. If you have any problems or questions after discharge, please call your doctor. °ACTIVITY °· You may resume your regular activity but move at a slower pace for the next 24 hours.  °· Take frequent rest periods for the next 24 hours.  °· Walking will help expel (get rid of) the air and reduce the bloated feeling in your abdomen.  °· No driving for 24 hours (because of the anesthesia (medicine) used during the test).  °· You may shower.  °· Do not sign any important   legal documents or operate any machinery for 24 hours (because of the anesthesia used during the test).  NUTRITION  Drink plenty of fluids.   You may resume your normal diet.   Begin with a light meal and progress to your normal diet.   Avoid alcoholic beverages for 24 hours or as instructed by your caregiver.  MEDICATIONS  You may resume your normal medications unless your caregiver tells you otherwise.  WHAT YOU CAN EXPECT TODAY  You may experience abdominal discomfort such as a feeling of fullness  or gas pains.  FOLLOW-UP  Your doctor will discuss the results of your test with you.  SEEK IMMEDIATE MEDICAL ATTENTION IF ANY OF THE FOLLOWING OCCUR:  Excessive nausea (feeling sick to your stomach) and/or vomiting.   Severe abdominal pain and distention (swelling).   Trouble swallowing.   Temperature over 101 F (37.8 C).   Rectal bleeding or vomiting of blood.     Diverticulosis information provided  Constipation information provided  Benefiber 1 tablespoon twice daily.  Continue MiraLAX twice daily as needed for constipation.  Continue Linzess 145 daily  Repeat colonoscopy in 5 years  Office visit with [place] in 3 months  Call [phone] for an appoinement, if they do not contact you within a week.    Diverticulosis Diverticulosis is a condition that develops when small pouches (diverticula) form in the wall of the large intestine (colon). The colon is where water is absorbed and stool is formed. The pouches form when the inside layer of the colon pushes through weak spots in the outer layers of the colon. You may have a few pouches or many of them. What are the causes? The cause of this condition is not known. What increases the risk? The following factors may make you more likely to develop this condition:  Being older than age 82. Your risk for this condition increases with age. Diverticulosis is rare among people younger than age 103. By age 65, many people have it.  Eating a low-fiber diet.  Having frequent constipation.  Being overweight.  Not getting enough exercise.  Smoking.  Taking over-the-counter pain medicines, like aspirin and ibuprofen.  Having a family history of diverticulosis.  What are the signs or symptoms? In most people, there are no symptoms of this condition. If you do have symptoms, they may include:  Bloating.  Cramps in the abdomen.  Constipation or diarrhea.  Pain in the lower left side of the abdomen.  How is this  diagnosed? This condition is most often diagnosed during an exam for other colon problems. Because diverticulosis usually has no symptoms, it often cannot be diagnosed independently. This condition may be diagnosed by:  Using a flexible scope to examine the colon (colonoscopy).  Taking an X-ray of the colon after dye has been put into the colon (barium enema).  Doing a CT scan.  How is this treated? You may not need treatment for this condition if you have never developed an infection related to diverticulosis. If you have had an infection before, treatment may include:  Eating a high-fiber diet. This may include eating more fruits, vegetables, and grains.  Taking a fiber supplement.  Taking a live bacteria supplement (probiotic).  Taking medicine to relax your colon.  Taking antibiotic medicines.  Follow these instructions at home:  Drink 6-8 glasses of water or more each day to prevent constipation.  Try not to strain when you have a bowel movement.  If you have had an infection  before: ? Eat more fiber as directed by your health care provider or your diet and nutrition specialist (dietitian). ? Take a fiber supplement or probiotic, if your health care provider approves.  Take over-the-counter and prescription medicines only as told by your health care provider.  If you were prescribed an antibiotic, take it as told by your health care provider. Do not stop taking the antibiotic even if you start to feel better.  Keep all follow-up visits as told by your health care provider. This is important. Contact a health care provider if:  You have pain in your abdomen.  You have bloating.  You have cramps.  You have not had a bowel movement in 3 days. Get help right away if:  Your pain gets worse.  Your bloating becomes very bad.  You have a fever or chills, and your symptoms suddenly get worse.  You vomit.  You have bowel movements that are bloody or black.  You  have bleeding from your rectum. Summary  Diverticulosis is a condition that develops when small pouches (diverticula) form in the wall of the large intestine (colon).  You may have a few pouches or many of them.  This condition is most often diagnosed during an exam for other colon problems.  If you have had an infection related to diverticulosis, treatment may include increasing the fiber in your diet, taking supplements, or taking medicines. This information is not intended to replace advice given to you by your health care provider. Make sure you discuss any questions you have with your health care provider.   Constipation, Adult Constipation is when a person:  Poops (has a bowel movement) fewer times in a week than normal.  Has a hard time pooping.  Has poop that is dry, hard, or bigger than normal.  Follow these instructions at home: Eating and drinking   Eat foods that have a lot of fiber, such as: ? Fresh fruits and vegetables. ? Whole grains. ? Beans.  Eat less of foods that are high in fat, low in fiber, or overly processed, such as: ? [place] fries. ? Hamburgers. ? Cookies. ? Candy. ? Soda.  Drink enough fluid to keep your pee (urine) clear or pale yellow. General instructions  Exercise regularly or as told by your doctor.  Go to the restroom when you feel like you need to poop. Do not hold it in.  Take over-the-counter and prescription medicines only as told by your doctor. These include any fiber supplements.  Do pelvic floor retraining exercises, such as: ? Doing deep breathing while relaxing your lower belly (abdomen). ? Relaxing your pelvic floor while pooping.  Watch your condition for any changes.  Keep all follow-up visits as told by your doctor. This is important. Contact a doctor if:  You have pain that gets worse.  You have a fever.  You have not pooped for 4 days.  You throw up (vomit).  You are not hungry.  You lose  weight.  You are bleeding from the anus.  You have thin, pencil-like poop (stool). Get help right away if:  You have a fever, and your symptoms suddenly get worse.  You leak poop or have blood in your poop.  Your belly feels hard or bigger than normal (is bloated).  You have very bad belly pain.  You feel dizzy or you faint. This information is not intended to replace advice given to you by your health care provider. Make sure you discuss any questions  you have with your health care provider.

## 2017-07-03 NOTE — Op Note (Signed)
[facility] Patient Name: Jacob Nash Procedure Date: 07/03/2017 1:15 PM MRN: [id number] Date of Birth: 1946/04/07 Attending MD: Norvel Richards , MD CSN: [id number] Age: 72 Admit Type: Outpatient Procedure:                Upper GI endoscopy Indications:              Abdominal pain in the right upper quadrant,                            Heartburn Providers:                Norvel Richards, MD, Lurline Del, RN, Nelma Rothman, Technician Referring MD:              Medicines:                Midazolam 8 mg IV, Meperidine 951 mg IV Complications:            No immediate complications. Estimated Blood Loss:     Estimated blood loss: none. Procedure:                Pre-Anesthesia Assessment:                           - Prior to the procedure, a History and Physical                            was performed, and patient medications and                            allergies were reviewed. The patient's tolerance of                            previous anesthesia was also reviewed. The risks                            and benefits of the procedure and the sedation                            options and risks were discussed with the patient.                            All questions were answered, and informed consent                            was obtained. Prior Anticoagulants: The patient has                            taken no previous anticoagulant or antiplatelet                            agents. ASA Grade Assessment: II - A patient with  mild systemic disease. After reviewing the risks                            and benefits, the patient was deemed in                            satisfactory condition to undergo the procedure.                           After obtaining informed consent, the endoscope was                            passed under direct vision. Throughout the                            procedure, the patient's blood  pressure, pulse, and                            oxygen saturations were monitored continuously. The                            [id number]) was introduced through the mouth,                            and advanced to the second part of duodenum. The                            upper GI endoscopy was accomplished without                            difficulty. The patient tolerated the procedure                            well. Scope In: 1:39:34 PM Scope Out: 1:42:33 PM Total Procedure Duration: 0 hours 2 minutes 59 seconds  Findings:      The examined esophagus was normal.      A small hiatal hernia was present.      The exam was otherwise without abnormality.      The duodenal bulb and second portion of the duodenum were normal aside       from a large D2 diverticulum. Impression:               - Normal esophagus.                           - Small hiatal hernia.                           - The examination was otherwise normal.                           - Normal duodenal bulb and second portion of the                            duodenum.                           -  No specimens collected. Moderate Sedation:      Moderate (conscious) sedation was administered by the endoscopy nurse       and supervised by the endoscopist. The following parameters were       monitored: oxygen saturation, heart rate, blood pressure, respiratory       rate, EKG, adequacy of pulmonary ventilation, and response to care.       Total physician intraservice time was 17 minutes. Recommendation:           - Patient has a contact number available for                            emergencies. The signs and symptoms of potential                            delayed complications were discussed with the                            patient. Return to normal activities tomorrow.                            Written discharge instructions were provided to the                            patient.                            - Resume previous diet.                           - Continue present medications.                           - No repeat upper endoscopy. See colonoscopy report.                           - Return to GI office in 8 weeks. Procedure Code(s):        --- Professional ---                           [phone], Esophagogastroduodenoscopy, flexible,                            transoral; diagnostic, including collection of                            specimen(s) by brushing or washing, when performed                            (separate procedure)                           99152, Moderate sedation services provided by the                            same physician or other qualified health care  professional performing the diagnostic or                            therapeutic service that the sedation supports,                            requiring the presence of an independent trained                            observer to assist in the monitoring of the                            patient's level of consciousness and physiological                            status; initial 15 minutes of intraservice time,                            patient age 75 years or older Diagnosis Code(s):        --- Professional ---                           K44.9, Diaphragmatic hernia without obstruction or                            gangrene                           R10.11, Right upper quadrant pain                           R12, Heartburn CPT copyright 2016 [employer]. All rights reserved. The codes documented in this report are preliminary and upon coder review may  be revised to meet current compliance requirements. Cristopher Estimable. Azelia Reiger, MD Norvel Richards, MD 07/03/2017 2:10:27 PM This report has been signed electronically. Number of Addenda: 0

## 2017-07-03 NOTE — Op Note (Signed)
[facility] Patient Name: Jacob Nash Procedure Date: 07/03/2017 1:44 PM MRN: [id number] Date of Birth: 01-21-1946 Attending MD: Norvel Richards , MD CSN: [id number] Age: 72 Admit Type: Outpatient Procedure:                Colonoscopy Indications:              High risk colon cancer surveillance: Personal                            history of colonic polyps Providers:                Norvel Richards, MD, Lurline Del, RN, Nelma Rothman, Technician Referring MD:              Medicines:                Midazolam 9 mg IV, Meperidine 150 mg IV,                            Ondansetron 4 mg IV Complications:            No immediate complications. Estimated Blood Loss:     Estimated blood loss: none. Estimated blood loss:                            none. Procedure:                Pre-Anesthesia Assessment:                           - Prior to the procedure, a History and Physical                            was performed, and patient medications and                            allergies were reviewed. The patient's tolerance of                            previous anesthesia was also reviewed. The risks                            and benefits of the procedure and the sedation                            options and risks were discussed with the patient.                            All questions were answered, and informed consent                            was obtained. Prior Anticoagulants: The patient has                            taken  no previous anticoagulant or antiplatelet                            agents. ASA Grade Assessment: II - A patient with                            mild systemic disease. After reviewing the risks                            and benefits, the patient was deemed in                            satisfactory condition to undergo the procedure.                           After obtaining informed consent, the colonoscope              was passed under direct vision. Throughout the                            procedure, the patient's blood pressure, pulse, and                            oxygen saturations were monitored continuously. The                            EC-[id number] ([id number]) scope was introduced through                            the anus and advanced to the the cecum, identified                            by appendiceal orifice and ileocecal valve. The                            ileocecal valve, appendiceal orifice, and rectum                            were photographed. The quality of the bowel                            preparation was adequate. Scope In: 1:49:43 PM Scope Out: 2:06:54 PM Scope Withdrawal Time: 0 hours 6 minutes 39 seconds  Total Procedure Duration: 0 hours 17 minutes 11 seconds  Findings:      The perianal and digital rectal examinations were normal.      Many large-mouthed diverticula were found in the sigmoid colon and       descending colon. Elongated, redundant colon.      The exam was otherwise without abnormality on direct and retroflexion       views. Impression:               - Diverticulosis in the sigmoid colon and in the  descending colon.                           - The examination was otherwise normal on direct                            and retroflexion views.                           - No specimens collected. Moderate Sedation:      Moderate (conscious) sedation was administered by the endoscopy nurse       and supervised by the endoscopist. The following parameters were       monitored: oxygen saturation, heart rate, blood pressure, respiratory       rate, EKG, adequacy of pulmonary ventilation, and response to care.       Total physician intraservice time was 41 minutes. Recommendation:           - Patient has a contact number available for                            emergencies. The signs and symptoms of potential                             delayed complications were discussed with the                            patient. Return to normal activities tomorrow.                            Written discharge instructions were provided to the                            patient.                           - Resume previous diet.                           - Continue present medications.                           - Repeat colonoscopy in 5 years for screening                            purposes. Continue MiraLAX twice daily as needed.                            Add Benefiber 1 tablespoon twice daily. See EGD                            report.                           - Return to GI clinic in 3 months. Procedure Code(s):        --- Professional ---  [phone], Colonoscopy, flexible; diagnostic, including                            collection of specimen(s) by brushing or washing,                            when performed (separate procedure)                           99152, Moderate sedation services provided by the                            same physician or other qualified health care                            professional performing the diagnostic or                            therapeutic service that the sedation supports,                            requiring the presence of an independent trained                            observer to assist in the monitoring of the                            patient's level of consciousness and physiological                            status; initial 15 minutes of intraservice time,                            patient age 76 years or older                           [phone], Moderate sedation services; each additional                            15 minutes intraservice time                           99153, Moderate sedation services; each additional                            15 minutes intraservice time Diagnosis Code(s):        --- Professional ---                            Z86.010, Personal history of colonic polyps                           K57.30, Diverticulosis of large intestine without  perforation or abscess without bleeding CPT copyright 2016 [employer]. All rights reserved. The codes documented in this report are preliminary and upon coder review may  be revised to meet current compliance requirements. Cristopher Estimable. Athens Lebeau, MD Norvel Richards, MD 07/03/2017 2:17:31 PM This report has been signed electronically. Number of Addenda: 0

## 2017-07-03 NOTE — Interval H&P Note (Signed)
History and Physical Interval Note:  07/03/2017 1:23 PM  VEGA WITHROW  has presented today for surgery, with the diagnosis of h/o colon polyps, change in bowels, Epigastric and RUQ pain  The various methods of treatment have been discussed with the patient and family. After consideration of risks, benefits and other options for treatment, the patient has consented to  Procedure(s) with comments: COLONOSCOPY (N/A) - 1:45pm ESOPHAGOGASTRODUODENOSCOPY (EGD) (N/A) as a surgical intervention .  The patient's history has been reviewed, patient examined, no change in status, stable for surgery.  I have reviewed the patient's chart and labs.  Questions were answered to the patient's satisfaction.     Lajean Boese  Abdominal pain much better with frequent use of MiraLAX. CT negative for anything acute; cholelithiasis. Patient denies dysphagia..  EGD and colonoscopy today per plan.  The risks, benefits, limitations, imponderables and alternatives regarding both EGD and colonoscopy have been reviewed with the patient. Questions have been answered. All parties agreeable.

## 2017-07-06 ENCOUNTER — Encounter: Payer: Self-pay | Admitting: Internal Medicine

## 2017-07-07 ENCOUNTER — Encounter (HOSPITAL_COMMUNITY): Payer: Self-pay | Admitting: Internal Medicine

## 2017-07-15 ENCOUNTER — Ambulatory Visit: Payer: Medicare Other | Admitting: Gastroenterology

## 2017-09-14 DIAGNOSIS — C61 Malignant neoplasm of prostate: Secondary | ICD-10-CM | POA: Diagnosis not present

## 2017-09-23 DIAGNOSIS — Z6828 Body mass index (BMI) 28.0-28.9, adult: Secondary | ICD-10-CM | POA: Diagnosis not present

## 2017-09-23 DIAGNOSIS — K4090 Unilateral inguinal hernia, without obstruction or gangrene, not specified as recurrent: Secondary | ICD-10-CM | POA: Diagnosis not present

## 2017-09-23 DIAGNOSIS — L42 Pityriasis rosea: Secondary | ICD-10-CM | POA: Diagnosis not present

## 2017-10-05 ENCOUNTER — Encounter: Payer: Self-pay | Admitting: Gastroenterology

## 2017-10-05 ENCOUNTER — Ambulatory Visit (INDEPENDENT_AMBULATORY_CARE_PROVIDER_SITE_OTHER): Payer: Medicare Other | Admitting: Gastroenterology

## 2017-10-05 ENCOUNTER — Telehealth: Payer: Self-pay

## 2017-10-05 VITALS — BP 129/79 | HR 69 | Temp 97.2°F | Ht 71.0 in | Wt 209.0 lb

## 2017-10-05 DIAGNOSIS — K5900 Constipation, unspecified: Secondary | ICD-10-CM

## 2017-10-05 DIAGNOSIS — R1013 Epigastric pain: Secondary | ICD-10-CM | POA: Diagnosis not present

## 2017-10-05 DIAGNOSIS — R140 Abdominal distension (gaseous): Secondary | ICD-10-CM

## 2017-10-05 NOTE — Telephone Encounter (Signed)
Pt was seen in office today. Pt said when he got home, it was a message asking him to call. Pt says LSL left the message.

## 2017-10-05 NOTE — Progress Notes (Signed)
CC'ED TO PCP 

## 2017-10-05 NOTE — Assessment & Plan Note (Signed)
Currently controlled with prune juice. Continue current regimen.

## 2017-10-05 NOTE — Patient Instructions (Signed)
1. Please consider cutting back on any foods in the [place] list to see if this helps with your bloating symptoms. You do not have to completely eliminate the foods.  2. If no improvement with bloating/discomfort with meals after change in diet, then consider adding back omeprazole once daily before breakfast. You can stop zantac if you go back on omeprazole.  3. Please let me know how you are doing once you get back on omeprazole. Just call with a progress report.  4. Return to the office in six months.

## 2017-10-05 NOTE — Progress Notes (Signed)
Primary Care Physician: Manon Hilding, MD  Primary Gastroenterologist:  Garfield Cornea, MD   Chief Complaint  Patient presents with  . Constipation    f/u. BM about once a day. stopped linzess d/t cost  . Abdominal Pain    RLQ AND CENTER OF ABD  . Bloated    seeing Dr. Arnoldo Morale tomorrow to see if it is related to another hernia    HPI: Jacob Nash is a 72 y.o. male here for follow-up of colonoscopy.  Patient has a history of chronic constipation for years.  Had managed well with prune juice but this year had to add MiraLAX at bedtime and still was not having good results.  When we saw him back in January he had complaint of right lower quadrant pain as well.  He was treated with antibiotics for possible diverticulitis and epididymitis that relief.  CT abdomen pelvis with contrast performed for persistent pain, found to have a moderate amount of stool in the ascending, transverse colon, lesser extent in the descending colon, cholelithiasis but nothing to really explain his pain.  Colonoscopy performed in February showing long redundant colon, diverticulosis.  EGD performed showing small hiatal hernia.  Next colonoscopy planned for 5 years.  Patient states the Linzess worked but was too expensive. $500/month towards his spending limit for Medicare. Since he had colon purge for colonoscopy he doesn't feel like he's needed the Linzess. He is back to having daily BM with use of prune juice. Patient really believes a lot of his GI symptoms started after he came off omeprazole under direction/recommendation of his PCP. He was switched to ranitidine. Complains of postprandial bloating and abdominal discomfort, increased gas. No heartburn. He has taken omeprazole on occasion in the past few months and had resolution of his symptoms.   Patient has appt tomorrow with Dr. Arnoldo Morale to be evaluated for possible right inguinal hernia. He has discomfort in RLQ. Feels a bulge and when he lays down he is  able to push in the bulge and get relief.    Current Outpatient Medications  Medication Sig Dispense Refill  . acetaminophen (TYLENOL) 500 MG tablet Take 500 mg by mouth every 6 (six) hours as needed for moderate pain or headache.     . ALPRAZolam (XANAX) 0.5 MG tablet Take 0.5 mg by mouth 2 (two) times daily. Mid-afternoon & 1 hour before bedtime.  5  . aspirin EC 81 MG tablet Take 81 mg by mouth daily after supper.    . Cholecalciferol (VITAMIN D) 2000 units tablet Take 2,000 Units by mouth daily after lunch.    . fluticasone (FLONASE) 50 MCG/ACT nasal spray Place 2 sprays into both nostrils daily.   10  . lisinopril (PRINIVIL,ZESTRIL) 40 MG tablet Take 40 mg by mouth daily after lunch.   12  . loratadine (CLARITIN) 10 MG tablet Take 10 mg by mouth daily after lunch.     . Multiple Vitamins-Minerals (MULTIVITAMIN PO) Take 1 tablet by mouth daily.    . Omega-3 Fatty Acids (FISH OIL) 1200 MG CAPS Take 2,400 mg by mouth daily after lunch.    . ranitidine (ZANTAC) 150 MG tablet Take 150 mg by mouth 2 (two) times daily.  12  . simvastatin (ZOCOR) 20 MG tablet Take 10 mg by mouth daily after supper.   12  . triamcinolone cream (KENALOG) 0.1 % Apply 1 application topically 2 (two) times daily as needed (for dry skin patche/itchy skin).  No current facility-administered medications for this visit.     Allergies as of 10/05/2017  . (No Known Allergies)    ROS:  General: Negative for anorexia, weight loss, fever, chills, fatigue, weakness. ENT: Negative for hoarseness, difficulty swallowing , nasal congestion. CV: Negative for chest pain, angina, palpitations, dyspnea on exertion, peripheral edema.  Respiratory: Negative for dyspnea at rest, dyspnea on exertion, cough, sputum, wheezing.  GI: See history of present illness. GU:  Negative for dysuria, hematuria, urinary incontinence, urinary frequency, nocturnal urination.  Endo: Negative for unusual weight change.    Physical  Examination:   BP 129/79   Pulse 69   Temp (!) 97.2 F (36.2 C) (Oral)   Ht 5\' 11"  (1.803 m)   Wt 209 lb (94.8 kg)   BMI 29.15 kg/m   General: Well-nourished, well-developed in no acute distress. Accompanied by spouse.  Eyes: No icterus. Mouth: Oropharyngeal mucosa moist and pink , no lesions erythema or exudate. Lungs: Clear to auscultation bilaterally.  Heart: Regular rate and rhythm, no murmurs rubs or gallops.  Abdomen: Bowel sounds are normal, nontender, nondistended, no hepatosplenomegaly or masses, no abdominal bruits, abdominal wall hernia appreciated, no rebound or guarding.   Extremities: No lower extremity edema. No clubbing or deformities. Neuro: Alert and oriented x 4   Skin: Warm and dry, no jaundice.   Psych: Alert and cooperative, normal mood and affect.     Imaging Studies: No results found.

## 2017-10-05 NOTE — Telephone Encounter (Signed)
LSL, please disregard this message. Pt has an appointment with Dr. Arnoldo Morale tomorrow and the person that left him a VM, name is Magda Paganini.

## 2017-10-05 NOTE — Assessment & Plan Note (Signed)
Postprandial bloating/gas, abdominal discomfort appreciated after coming off omeprazole. EGD reassuring. Discussed options of dietary changes, decrease high FODMAP foods for 1-2 weeks to see if any relief. If no better, I would consider restarting omeprazole once daily as patient believes his symptoms were controlled on this. If he finds improvement on omeprazole, the benefit of the medication would likely outweigh any potential risks. He describes daily discomfort and suffering since being off the omeprazole and the ranitidine does not provide relief.   He will let me know how he is doing in a few weeks. Return OV in six months.

## 2017-10-06 ENCOUNTER — Encounter: Payer: Self-pay | Admitting: General Surgery

## 2017-10-06 ENCOUNTER — Ambulatory Visit: Payer: Medicare Other | Admitting: General Surgery

## 2017-10-06 ENCOUNTER — Ambulatory Visit (INDEPENDENT_AMBULATORY_CARE_PROVIDER_SITE_OTHER): Payer: Medicare Other | Admitting: General Surgery

## 2017-10-06 VITALS — BP 148/99 | HR 73 | Temp 98.9°F | Wt 208.0 lb

## 2017-10-06 DIAGNOSIS — K4090 Unilateral inguinal hernia, without obstruction or gangrene, not specified as recurrent: Secondary | ICD-10-CM | POA: Diagnosis not present

## 2017-10-06 NOTE — Patient Instructions (Signed)

## 2017-10-06 NOTE — Progress Notes (Signed)
Jacob Nash; [id number]; Nov 22, 1945   HPI Patient is a 72 year old white male who was referred to my care by Dr. Quintin Alto for evaluation and treatment of a right inguinal hernia.  The patient has noted over the past few months that he has had increasing discomfort when standing or straining in the right groin region.  He does feel that he popped something back in when he pushes.  He denies any nausea or vomiting.  His pain level is a 2 when he is sticking out.  Due to his body habitus, he cannot visually see the lump. Past Medical History:  Diagnosis Date  . Anxiety   . Arthritis   . Cancer [facility]) MARCH 2017   PROSTATE   . Diabetes mellitus without complication ([place])   . Elevated cholesterol   . Frequent urination   . GERD (gastroesophageal reflux disease)   . Hemorrhoids   . Hypertension   . Joint pain   . PONV (postoperative nausea and vomiting)    NAUSEA ONLY    Past Surgical History:  Procedure Laterality Date  . [place] AND 1996   LOWER BACK  . COLONOSCOPY N/A 07/03/2017   Dr. Gala Romney: Long redundant colon, diverticulosis, next colonoscopy in 5 years given personal history of colon polyps  . ESOPHAGOGASTRODUODENOSCOPY N/A 07/03/2017   Dr. Gala Romney: Small hiatal hernia  . HERNIA REPAIR  03-21-2009    LEFT INGUINAL  . INCISIONAL HERNIA REPAIR N/A 08/11/2016   Procedure: Fatima Blank HERNIORRHAPHY  WITH MESH;  Surgeon: Aviva Signs, MD;  Location: [facility];  Service: General;  Laterality: N/A;  . LYMPHADENECTOMY Bilateral 10/11/2015   Procedure: BILATERAL PELVIC LYMPHADENECTOMY;  Surgeon: Raynelle Bring, MD;  Location: [facility];  Service: Urology;  Laterality: Bilateral;  . PROSTATE BIOPSY  07-2015  . ROBOT ASSISTED LAPAROSCOPIC RADICAL PROSTATECTOMY N/A 10/11/2015   Procedure: XI ROBOTIC ASSISTED LAPAROSCOPIC RADICAL PROSTATECTOMY LEVEL 2;  Surgeon: Raynelle Bring, MD;  Location: [facility];  Service: Urology;  Laterality: N/A;  . SHOULDER SURGERY Right 06-06-14   MUSCLE TEAR    History  reviewed. No pertinent family history.  Current Outpatient Medications on File Prior to Visit  Medication Sig Dispense Refill  . acetaminophen (TYLENOL) 500 MG tablet Take 500 mg by mouth every 6 (six) hours as needed for moderate pain or headache.     . ALPRAZolam (XANAX) 0.5 MG tablet Take 0.5 mg by mouth 2 (two) times daily. Mid-afternoon & 1 hour before bedtime.  5  . aspirin EC 81 MG tablet Take 81 mg by mouth daily after supper.    . Cholecalciferol (VITAMIN D) 2000 units tablet Take 2,000 Units by mouth daily after lunch.    . fluticasone (FLONASE) 50 MCG/ACT nasal spray Place 2 sprays into both nostrils daily.   10  . lisinopril (PRINIVIL,ZESTRIL) 40 MG tablet Take 40 mg by mouth daily after lunch.   12  . loratadine (CLARITIN) 10 MG tablet Take 10 mg by mouth daily after lunch.     . Multiple Vitamins-Minerals (MULTIVITAMIN PO) Take 1 tablet by mouth daily.    . Omega-3 Fatty Acids (FISH OIL) 1200 MG CAPS Take 2,400 mg by mouth daily after lunch.    . ranitidine (ZANTAC) 150 MG tablet Take 150 mg by mouth 2 (two) times daily.  12  . simvastatin (ZOCOR) 20 MG tablet Take 10 mg by mouth daily after supper.   12  . triamcinolone cream (KENALOG) 0.1 % Apply 1 application topically 2 (two) times daily as  needed (for dry skin patche/itchy skin).      No current facility-administered medications on file prior to visit.     No Known Allergies  Social History   Substance and Sexual Activity  Alcohol Use No    Social History   Tobacco Use  Smoking Status Never Smoker  Smokeless Tobacco Never Used    Review of Systems  Constitutional: Negative.   HENT: Negative.   Eyes: Negative.   Respiratory: Negative.   Cardiovascular: Negative.   Gastrointestinal: Positive for abdominal pain and heartburn.  Genitourinary: Negative.   Musculoskeletal: Positive for back pain and joint pain.  Skin: Positive for rash.  Neurological: Negative.   Endo/Heme/Allergies: Negative.    Psychiatric/Behavioral: Negative.     Objective   Vitals:   10/06/17 1023  BP: (!) 148/99  Pulse: 73  Temp: 98.9 F (37.2 C)    Physical Exam  Constitutional: He is oriented to person, place, and time. He appears well-developed and well-nourished.  Cardiovascular: Regular rhythm and normal heart sounds. Exam reveals no gallop and no friction rub.  No murmur heard. Pulmonary/Chest: Effort normal and breath sounds normal. No stridor. No respiratory distress. He has no wheezes. He has no rales.  Abdominal: Soft. Bowel sounds are normal. He exhibits no distension and no mass. There is no tenderness. There is no guarding. A hernia is present.  Reducible right inguinal hernia noted.  Genitourinary:  Genitourinary Comments: Genitourinary examination is within normal limits.  Neurological: He is alert and oriented to person, place, and time.  Skin: Skin is warm and dry.  Vitals reviewed. Dr. Edythe Lynn notes reviewed.  Assessment  Reducible right inguinal hernia Plan   Patient is scheduled for a right inguinal herniorrhaphy with mesh on 10/21/2017.  The risks and benefits of the procedure including bleeding, infection, mesh use, and the possibility of recurrence of the hernia were fully explained to the patient, who gave informed consent.

## 2017-10-06 NOTE — H&P (Signed)
Jacob Nash; [id number]; [birth year]   HPI Patient is a 72 year old white male who was referred to my care by Dr. Quintin Alto for evaluation and treatment of a right inguinal hernia.  The patient has noted over the past few months that he has had increasing discomfort when standing or straining in the right groin region.  He does feel that he popped something back in when he pushes.  He denies any nausea or vomiting.  His pain level is a 2 when he is sticking out.  Due to his body habitus, he cannot visually see the lump. Past Medical History:  Diagnosis Date  . Anxiety   . Arthritis   . Cancer [facility]) MARCH 2017   PROSTATE   . Diabetes mellitus without complication ([place])   . Elevated cholesterol   . Frequent urination   . GERD (gastroesophageal reflux disease)   . Hemorrhoids   . Hypertension   . Joint pain   . PONV (postoperative nausea and vomiting)    NAUSEA ONLY    Past Surgical History:  Procedure Laterality Date  . [place] AND 1996   LOWER BACK  . COLONOSCOPY N/A 07/03/2017   Dr. Gala Romney: Long redundant colon, diverticulosis, next colonoscopy in 5 years given personal history of colon polyps  . ESOPHAGOGASTRODUODENOSCOPY N/A 07/03/2017   Dr. Gala Romney: Small hiatal hernia  . HERNIA REPAIR  03-21-2009    LEFT INGUINAL  . INCISIONAL HERNIA REPAIR N/A 08/11/2016   Procedure: Fatima Blank HERNIORRHAPHY  WITH MESH;  Surgeon: Aviva Signs, MD;  Location: [facility];  Service: General;  Laterality: N/A;  . LYMPHADENECTOMY Bilateral 10/11/2015   Procedure: BILATERAL PELVIC LYMPHADENECTOMY;  Surgeon: Raynelle Bring, MD;  Location: [facility];  Service: Urology;  Laterality: Bilateral;  . PROSTATE BIOPSY  07-2015  . ROBOT ASSISTED LAPAROSCOPIC RADICAL PROSTATECTOMY N/A 10/11/2015   Procedure: XI ROBOTIC ASSISTED LAPAROSCOPIC RADICAL PROSTATECTOMY LEVEL 2;  Surgeon: Raynelle Bring, MD;  Location: [facility];  Service: Urology;  Laterality: N/A;  . SHOULDER SURGERY Right 06-06-14   MUSCLE TEAR    History  reviewed. No pertinent family history.  Current Outpatient Medications on File Prior to Visit  Medication Sig Dispense Refill  . acetaminophen (TYLENOL) 500 MG tablet Take 500 mg by mouth every 6 (six) hours as needed for moderate pain or headache.     . ALPRAZolam (XANAX) 0.5 MG tablet Take 0.5 mg by mouth 2 (two) times daily. Mid-afternoon & 1 hour before bedtime.  5  . aspirin EC 81 MG tablet Take 81 mg by mouth daily after supper.    . Cholecalciferol (VITAMIN D) 2000 units tablet Take 2,000 Units by mouth daily after lunch.    . fluticasone (FLONASE) 50 MCG/ACT nasal spray Place 2 sprays into both nostrils daily.   10  . lisinopril (PRINIVIL,ZESTRIL) 40 MG tablet Take 40 mg by mouth daily after lunch.   12  . loratadine (CLARITIN) 10 MG tablet Take 10 mg by mouth daily after lunch.     . Multiple Vitamins-Minerals (MULTIVITAMIN PO) Take 1 tablet by mouth daily.    . Omega-3 Fatty Acids (FISH OIL) 1200 MG CAPS Take 2,400 mg by mouth daily after lunch.    . ranitidine (ZANTAC) 150 MG tablet Take 150 mg by mouth 2 (two) times daily.  12  . simvastatin (ZOCOR) 20 MG tablet Take 10 mg by mouth daily after supper.   12  . triamcinolone cream (KENALOG) 0.1 % Apply 1 application topically 2 (two) times daily as  needed (for dry skin patche/itchy skin).      No current facility-administered medications on file prior to visit.     No Known Allergies  Social History   Substance and Sexual Activity  Alcohol Use No    Social History   Tobacco Use  Smoking Status Never Smoker  Smokeless Tobacco Never Used    Review of Systems  Constitutional: Negative.   HENT: Negative.   Eyes: Negative.   Respiratory: Negative.   Cardiovascular: Negative.   Gastrointestinal: Positive for abdominal pain and heartburn.  Genitourinary: Negative.   Musculoskeletal: Positive for back pain and joint pain.  Skin: Positive for rash.  Neurological: Negative.   Endo/Heme/Allergies: Negative.    Psychiatric/Behavioral: Negative.     Objective   Vitals:   10/06/17 1023  BP: (!) 148/99  Pulse: 73  Temp: 98.9 F (37.2 C)    Physical Exam  Constitutional: He is oriented to person, place, and time. He appears well-developed and well-nourished.  Cardiovascular: Regular rhythm and normal heart sounds. Exam reveals no gallop and no friction rub.  No murmur heard. Pulmonary/Chest: Effort normal and breath sounds normal. No stridor. No respiratory distress. He has no wheezes. He has no rales.  Abdominal: Soft. Bowel sounds are normal. He exhibits no distension and no mass. There is no tenderness. There is no guarding. A hernia is present.  Reducible right inguinal hernia noted.  Genitourinary:  Genitourinary Comments: Genitourinary examination is within normal limits.  Neurological: He is alert and oriented to person, place, and time.  Skin: Skin is warm and dry.  Vitals reviewed. Dr. Edythe Lynn notes reviewed.  Assessment  Reducible right inguinal hernia Plan   Patient is scheduled for a right inguinal herniorrhaphy with mesh on 10/21/2017.  The risks and benefits of the procedure including bleeding, infection, mesh use, and the possibility of recurrence of the hernia were fully explained to the patient, who gave informed consent.

## 2017-10-15 NOTE — Patient Instructions (Signed)
Jacob Nash  10/15/2017     @PREFPERIOPPHARMACY @   Your procedure is scheduled on Wednesday, June 5.  Report to [facility] at [phone] A.M.  Call this number if you have problems the morning of surgery:  [phone]   Remember:  No food after midnight.      Take these medicines the morning of surgery with A SIP OF WATER Lisinopril, claritin, zantac and prilosec if needed.    Do not wear jewelry, make-up or nail polish.  Do not wear lotions, powders, or perfumes, or deodorant.  Do not shave 48 hours prior to surgery.  Men may shave face and neck.  Do not bring valuables to the hospital.  [facility] is not responsible for any belongings or valuables.  Contacts, dentures or bridgework may not be worn into surgery.  Leave your suitcase in the car.  After surgery it may be brought to your room.  For patients admitted to the hospital, discharge time will be determined by your treatment team.  Patients discharged the day of surgery will not be allowed to drive home.   Name and phone number of your driver:   family Special instructions:  Follow any special instructions given to you by Dr. Arnoldo Morale.  Please read over the following fact sheets that you were given. Pain Booklet, Surgical Site Infection Prevention, Anesthesia Post-op Instructions and Care and Recovery After Surgery      Laparoscopic Inguinal Hernia Repair, Adult, Care After This sheet gives you information about how to care for yourself after your procedure. Your health care provider may also give you more specific instructions. If you have problems or questions, contact your health care provider. What can I expect after the procedure? After the procedure, it is common to have:  Pain.  Swelling and bruising around the incision area.  Scrotal swelling, in men.  Some fluid or blood draining from your incisions.  Follow these instructions at home: Incision care  Follow instructions from your health care  provider about how to take care of your incisions. Make sure you: ? Wash your hands with soap and water before you change your bandage (dressing). If soap and water are not available, use hand sanitizer. ? Change your dressing as told by your health care provider. ? Leave stitches (sutures), skin glue, or adhesive strips in place. These skin closures may need to stay in place for 2 weeks or longer. If adhesive strip edges start to loosen and curl up, you may trim the loose edges. Do not remove adhesive strips completely unless your health care provider tells you to do that.  Check your incision area every day for signs of infection. Check for: ? More redness, swelling, or pain. ? More fluid or blood. ? Warmth. ? Pus or a bad smell.  Wear loose, soft clothing while your incisions heal. Driving  Do not drive or use heavy machinery while taking prescription pain medicine.  Do not drive for 24 hours if you were given a medicine to help you relax (sedative) during your procedure. Activity  Do not lift anything that is heavier than 10 lb (4.5 kg), or the limit that you are told, until your health care provider says that it is safe.  Ask your health care provider what activities are safe for you.A lot of activity during the first week after surgery can increase pain and swelling. For 1 week after your procedure: ? Avoid activities that take a lot of effort, such as exercise  or sports. ? You may walk and climb stairs as needed for daily activity, but avoid long walks or climbing stairs for exercise. Managing pain and swelling  Put ice on painful or swollen areas: ? Put ice in a plastic bag. ? Place a towel between your skin and the bag. ? Leave the ice on for 20 minutes, 2-3 times a day. General instructions  Do not take baths, swim, or use a hot tub until your health care provider approves. Ask your health care provider if you may take showers. You may only be allowed to take sponge  baths.  Take over-the-counter and prescription medicines only as told by your health care provider.  To prevent or treat constipation while you are taking prescription pain medicine, your health care provider may recommend that you: ? Drink enough fluid to keep your urine pale yellow. ? Take over-the-counter or prescription medicines. ? Eat foods that are high in fiber, such as fresh fruits and vegetables, whole grains, and beans. ? Limit foods that are high in fat and processed sugars, such as fried and sweet foods.  Do not use any products that contain nicotine or tobacco, such as cigarettes and e-cigarettes. If you need help quitting, ask your health care provider.  Drink enough fluid to keep your urine pale yellow.  Keep all follow-up visits as told by your health care provider. This is important. Contact a health care provider if:  You have more redness, swelling, or pain around your incisions or your groin area.  You have more swelling in your scrotum.  You have more fluid or blood coming from your incisions.  Your incisions feel warm to the touch.  You have severe pain and medicines do not help.  You have abdominal pain or swelling.  You cannot eat or drink without vomiting.  You cannot urinate or pass a bowel movement.  You faint.  You feel dizzy.  You have nausea and vomiting.  You have a fever. Get help right away if:  You have pus or a bad smell coming from your incisions.  You have chest pain.  You have problems breathing. Summary  Pain, swelling, and bruising are common after the procedure.  Check your incision area every day for signs of infection, such as more redness, swelling, or pain.  Put ice on painful or swollen areas for 20 minutes, 2-3 times a day. This information is not intended to replace advice given to you by your health care provider. Make sure you discuss any questions you have with your health care provider. Document Released:  08/14/2016 Document Revised: 08/14/2016 Document Reviewed: 08/14/2016 Elsevier Interactive Patient Education  2018 [employer]. Laparoscopic Inguinal Hernia Repair, Adult Laparoscopic inguinal hernia repair is a surgical procedure to repair a small weak spot in the groin muscles that allows fat or intestine from inside the abdomen to bulge out (inguinal hernia). This procedure may be planned, or it may be an emergency procedure. During the procedure, tissue that has bulged out is moved back into place, and the opening in the groin muscles is repaired. This is done through three small incisions in the abdomen. A thin tube with a light and camera on the end (laparoscope) will be used to help perform the procedure. Tell a health care provider about:  Any allergies you have.  All medicines you are taking, including vitamins, herbs, eye drops, creams, and over-the-counter medicines.  Any problems you or family members have had with anesthetic medicines.  Any blood  disorders you have.  Any surgeries you have had.  Any medical conditions you have.  Whether you are pregnant or may be pregnant. What are the risks? Generally, this is a safe procedure. However, problems may occur, including:  Infection.  Bleeding.  Allergic reactions to medicines.  Damage to other structures or organs.  Long-term pain and swelling of the scrotum, in men.  Testicle damage in men.  Inability to completely empty the bladder (urinary retention).  A collection of fluid that builds up under the skin (seroma).  The hernia coming back (recurrence).  What happens before the procedure? Medicines  Ask your health care provider about: ? Changing or stopping your regular medicines. This is especially important if you are taking diabetes medicines or blood thinners. ? Taking over-the-counter medicines, vitamins, herbs, and supplements. ? Taking medicines such as aspirin and ibuprofen. These medicines can thin  your blood. Do not take these medicines unless your health care provider tells you to take them.  You may be given antibiotic medicine to help prevent an infection. Staying hydrated Follow instructions from your health care provider about hydration, which may include:  Up to 2 hours before the procedure - you may continue to drink clear liquids, such as water, clear fruit juice, black coffee, and plain tea.  Eating and drinking restrictions Follow instructions from your health care provider about eating and drinking, which may include:  8 hours before the procedure - stop eating heavy meals or foods such as meat, fried foods, or fatty foods.  6 hours before the procedure - stop eating light meals or foods, such as toast or cereal.  6 hours before the procedure - stop drinking milk or drinks that contain milk.  2 hours before the procedure - stop drinking clear liquids.  General instructions  Do not use any products that contain nicotine or tobacco, such as cigarettes and e-cigarettes. If you need help quitting, ask your health care provider.  You may be asked to shower with a germ-killing soap.  Plan to have someone take you home from the hospital or clinic.  Plan to have a responsible adult care for you for at least 24 hours after you leave the hospital or clinic. This is important. What happens during the procedure?  To lower your risk of infection: ? Your health care team will wash or sanitize their hands. ? Hair may be removed from the surgical area. ? Your skin will be washed with soap.  An IV will be inserted into one of your veins.  You will be given one or more of the following: ? A medicine to help you relax (sedative). ? A medicine to make you fall asleep (general anesthetic).  Three small incisions will be made in your abdomen.  Your abdomen will be inflated with carbon dioxide gas to make the surgical area easier to see.  A laparoscope and surgical instruments  will be inserted through the incisions. The laparoscope will send images of the inside of your abdomen to a monitor in the room.  Tissue that is bulging through the hernia may be removed or moved back into its normal place.  The hernia opening will be closed with a sheet of surgical mesh.  The surgical instruments and laparoscope will be removed.  Your incisions will be closed with stitches (sutures) and adhesive strips.  A bandage (dressing) will be placed over your incisions. The procedure may vary among health care providers and hospitals. What happens after the procedure?  Your blood pressure, heart rate, breathing rate, and blood oxygen level will be monitored until the medicines you were given have worn off.  You will be given pain medicine as needed.  You may continue to receive medicines and fluids through an IV. The IV will be removed after you can drink fluids well.  You will be encouraged to get up and move around, and to take deep breaths frequently.  Do not drive for 24 hours if you were given a sedative during your procedure. Summary  Laparoscopic inguinal hernia repair is a surgical procedure to repair a small weak spot in the groin muscles that allows fat or intestine from inside the abdomen to bulge out (inguinal hernia).  This procedure is done through three small incisions in the abdomen. A thin tube with a light and camera on the end (laparoscope) will be used to help perform the procedure.  After the procedure, you will be encouraged to get up and move around, and to take deep breaths frequently. This information is not intended to replace advice given to you by your health care provider. Make sure you discuss any questions you have with your health care provider. Document Released: 08/14/2016 Document Revised: 08/14/2016 Document Reviewed: 08/14/2016 Elsevier Interactive Patient Education  2018 [employer] Anesthesia, Adult, Care After These  instructions provide you with information about caring for yourself after your procedure. Your health care provider may also give you more specific instructions. Your treatment has been planned according to current medical practices, but problems sometimes occur. Call your health care provider if you have any problems or questions after your procedure. What can I expect after the procedure? After the procedure, it is common to have:  Vomiting.  A sore throat.  Mental slowness.  It is common to feel:  Nauseous.  Cold or shivery.  Sleepy.  Tired.  Sore or achy, even in parts of your body where you did not have surgery.  Follow these instructions at home: For at least 24 hours after the procedure:  Do not: ? Participate in activities where you could fall or become injured. ? Drive. ? Use heavy machinery. ? Drink alcohol. ? Take sleeping pills or medicines that cause drowsiness. ? Make important decisions or sign legal documents. ? Take care of children on your own.  Rest. Eating and drinking  If you vomit, drink water, juice, or soup when you can drink without vomiting.  Drink enough fluid to keep your urine clear or pale yellow.  Make sure you have little or no nausea before eating solid foods.  Follow the diet recommended by your health care provider. General instructions  Have a responsible adult stay with you until you are awake and alert.  Return to your normal activities as told by your health care provider. Ask your health care provider what activities are safe for you.  Take over-the-counter and prescription medicines only as told by your health care provider.  If you smoke, do not smoke without supervision.  Keep all follow-up visits as told by your health care provider. This is important. Contact a health care provider if:  You continue to have nausea or vomiting at home, and medicines are not helpful.  You cannot drink fluids or start eating  again.  You cannot urinate after 8-12 hours.  You develop a skin rash.  You have fever.  You have increasing redness at the site of your procedure. Get help right away if:  You have difficulty breathing.  You have chest pain.  You have unexpected bleeding.  You feel that you are having a life-threatening or urgent problem. This information is not intended to replace advice given to you by your health care provider. Make sure you discuss any questions you have with your health care provider. Document Released: 08/11/2000 Document Revised: 10/08/2015 Document Reviewed: 04/19/2015 Elsevier Interactive Patient Education  [employer].

## 2017-10-19 ENCOUNTER — Encounter (HOSPITAL_COMMUNITY)
Admission: RE | Admit: 2017-10-19 | Discharge: 2017-10-19 | Disposition: A | Discharge status: Home / Self Care | Payer: Medicare Other | Source: Ambulatory Visit | Attending: General Surgery | Admitting: General Surgery

## 2017-10-19 ENCOUNTER — Other Ambulatory Visit: Payer: Self-pay

## 2017-10-19 ENCOUNTER — Encounter (HOSPITAL_COMMUNITY): Payer: Self-pay

## 2017-10-19 DIAGNOSIS — K4090 Unilateral inguinal hernia, without obstruction or gangrene, not specified as recurrent: Secondary | ICD-10-CM | POA: Diagnosis not present

## 2017-10-19 DIAGNOSIS — K449 Diaphragmatic hernia without obstruction or gangrene: Secondary | ICD-10-CM | POA: Diagnosis not present

## 2017-10-19 DIAGNOSIS — I10 Essential (primary) hypertension: Secondary | ICD-10-CM | POA: Diagnosis not present

## 2017-10-19 DIAGNOSIS — M1990 Unspecified osteoarthritis, unspecified site: Secondary | ICD-10-CM | POA: Diagnosis not present

## 2017-10-19 DIAGNOSIS — Z7982 Long term (current) use of aspirin: Secondary | ICD-10-CM | POA: Diagnosis not present

## 2017-10-19 DIAGNOSIS — Z79899 Other long term (current) drug therapy: Secondary | ICD-10-CM | POA: Diagnosis not present

## 2017-10-19 DIAGNOSIS — E119 Type 2 diabetes mellitus without complications: Secondary | ICD-10-CM | POA: Diagnosis not present

## 2017-10-19 DIAGNOSIS — F419 Anxiety disorder, unspecified: Secondary | ICD-10-CM | POA: Diagnosis not present

## 2017-10-19 DIAGNOSIS — Z86010 Personal history of colonic polyps: Secondary | ICD-10-CM | POA: Diagnosis not present

## 2017-10-19 DIAGNOSIS — E7800 Pure hypercholesterolemia, unspecified: Secondary | ICD-10-CM | POA: Diagnosis not present

## 2017-10-19 DIAGNOSIS — K219 Gastro-esophageal reflux disease without esophagitis: Secondary | ICD-10-CM | POA: Diagnosis not present

## 2017-10-19 DIAGNOSIS — R350 Frequency of micturition: Secondary | ICD-10-CM | POA: Diagnosis not present

## 2017-10-19 DIAGNOSIS — Z8546 Personal history of malignant neoplasm of prostate: Secondary | ICD-10-CM | POA: Diagnosis not present

## 2017-10-19 LAB — CBC WITH DIFFERENTIAL/PLATELET
BASOS ABS: 0 10*3/uL (ref 0.0–0.1)
BASOS PCT: 0 %
EOS ABS: 0.1 10*3/uL (ref 0.0–0.7)
EOS PCT: 2 %
HCT: 43.4 % (ref 39.0–52.0)
Hemoglobin: 14 g/dL (ref 13.0–17.0)
Lymphocytes Relative: 33 %
Lymphs Abs: 2.1 10*3/uL (ref 0.7–4.0)
MCH: 30 pg (ref 26.0–34.0)
MCHC: 32.3 g/dL (ref 30.0–36.0)
MCV: 92.9 fL (ref 78.0–100.0)
Monocytes Absolute: 0.7 10*3/uL (ref 0.1–1.0)
Monocytes Relative: 11 %
Neutro Abs: 3.5 10*3/uL (ref 1.7–7.7)
Neutrophils Relative %: 54 %
PLATELETS: 265 10*3/uL (ref 150–400)
RBC: 4.67 MIL/uL (ref 4.22–5.81)
RDW: 13.1 % (ref 11.5–15.5)
WBC: 6.4 10*3/uL (ref 4.0–10.5)

## 2017-10-19 LAB — BASIC METABOLIC PANEL
Anion gap: 7 (ref 5–15)
BUN: 9 mg/dL (ref 6–20)
CO2: 30 mmol/L (ref 22–32)
Calcium: 9.3 mg/dL (ref 8.9–10.3)
Chloride: 103 mmol/L (ref 101–111)
Creatinine, Ser: 0.73 mg/dL (ref 0.61–1.24)
GFR calc Af Amer: >60 mL/min (ref >60)
GFR calc non Af Amer: >60 mL/min (ref >60)
Glucose, Bld: 121 mg/dL — ABNORMAL HIGH (ref 65–99)
Potassium: 4.3 mmol/L (ref 3.5–5.1)
Sodium: 140 mmol/L (ref 135–145)

## 2017-10-21 ENCOUNTER — Encounter (HOSPITAL_COMMUNITY)
Admission: RE | Disposition: A | Discharge status: Home / Self Care | Payer: Self-pay | Source: Ambulatory Visit | Attending: General Surgery

## 2017-10-21 ENCOUNTER — Ambulatory Visit (HOSPITAL_COMMUNITY)
Admission: RE | Admit: 2017-10-21 | Discharge: 2017-10-21 | Disposition: A | Discharge status: Home / Self Care | Payer: Medicare Other | Source: Ambulatory Visit | Attending: General Surgery | Admitting: General Surgery

## 2017-10-21 ENCOUNTER — Ambulatory Visit (HOSPITAL_COMMUNITY): Payer: Medicare Other | Admitting: Anesthesiology

## 2017-10-21 ENCOUNTER — Encounter (HOSPITAL_COMMUNITY): Payer: Self-pay

## 2017-10-21 DIAGNOSIS — E7800 Pure hypercholesterolemia, unspecified: Secondary | ICD-10-CM | POA: Diagnosis not present

## 2017-10-21 DIAGNOSIS — Z7982 Long term (current) use of aspirin: Secondary | ICD-10-CM | POA: Insufficient documentation

## 2017-10-21 DIAGNOSIS — Z86010 Personal history of colonic polyps: Secondary | ICD-10-CM | POA: Insufficient documentation

## 2017-10-21 DIAGNOSIS — R350 Frequency of micturition: Secondary | ICD-10-CM | POA: Insufficient documentation

## 2017-10-21 DIAGNOSIS — K449 Diaphragmatic hernia without obstruction or gangrene: Secondary | ICD-10-CM | POA: Diagnosis not present

## 2017-10-21 DIAGNOSIS — E119 Type 2 diabetes mellitus without complications: Secondary | ICD-10-CM | POA: Insufficient documentation

## 2017-10-21 DIAGNOSIS — F419 Anxiety disorder, unspecified: Secondary | ICD-10-CM | POA: Diagnosis not present

## 2017-10-21 DIAGNOSIS — I10 Essential (primary) hypertension: Secondary | ICD-10-CM | POA: Insufficient documentation

## 2017-10-21 DIAGNOSIS — K219 Gastro-esophageal reflux disease without esophagitis: Secondary | ICD-10-CM | POA: Diagnosis not present

## 2017-10-21 DIAGNOSIS — Z8546 Personal history of malignant neoplasm of prostate: Secondary | ICD-10-CM | POA: Diagnosis not present

## 2017-10-21 DIAGNOSIS — Z79899 Other long term (current) drug therapy: Secondary | ICD-10-CM | POA: Diagnosis not present

## 2017-10-21 DIAGNOSIS — K4090 Unilateral inguinal hernia, without obstruction or gangrene, not specified as recurrent: Secondary | ICD-10-CM | POA: Insufficient documentation

## 2017-10-21 DIAGNOSIS — M1990 Unspecified osteoarthritis, unspecified site: Secondary | ICD-10-CM | POA: Diagnosis not present

## 2017-10-21 HISTORY — PX: INGUINAL HERNIA REPAIR: SHX194

## 2017-10-21 SURGERY — REPAIR, HERNIA, INGUINAL, ADULT
Anesthesia: General | Laterality: Right

## 2017-10-21 MED ORDER — HYDROCODONE-ACETAMINOPHEN 7.5-325 MG PO TABS: 1.0000 | ORAL_TABLET | Freq: Once | ORAL | Status: DC | PRN

## 2017-10-21 MED ORDER — CEFAZOLIN SODIUM-DEXTROSE 2-4 GM/100ML-% IV SOLN
2.0000 g | INTRAVENOUS | Status: AC
  Administered 2017-10-21: 2 g via INTRAVENOUS
  Filled 2017-10-21: qty 100

## 2017-10-21 MED ORDER — KETOROLAC TROMETHAMINE 30 MG/ML IJ SOLN
30.0000 mg | Freq: Once | INTRAMUSCULAR | Status: AC
  Administered 2017-10-21: 30 mg via INTRAVENOUS
  Filled 2017-10-21: qty 1

## 2017-10-21 MED ORDER — ONDANSETRON HCL 4 MG/2ML IJ SOLN: 4.0000 mg | Freq: Once | INTRAMUSCULAR | Status: DC | PRN

## 2017-10-21 MED ORDER — CHLORHEXIDINE GLUCONATE CLOTH 2 % EX PADS: 6.0000 | MEDICATED_PAD | Freq: Once | CUTANEOUS | Status: DC

## 2017-10-21 MED ORDER — PROPOFOL 10 MG/ML IV BOLUS
INTRAVENOUS | Status: AC
  Filled 2017-10-21: qty 20

## 2017-10-21 MED ORDER — BUPIVACAINE LIPOSOME 1.3 % IJ SUSP
INTRAMUSCULAR | Status: AC
  Filled 2017-10-21: qty 20

## 2017-10-21 MED ORDER — ONDANSETRON HCL 4 MG/2ML IJ SOLN
INTRAMUSCULAR | Status: DC | PRN
  Administered 2017-10-21: 4 mg via INTRAVENOUS

## 2017-10-21 MED ORDER — POVIDONE-IODINE 10 % EX OINT
TOPICAL_OINTMENT | CUTANEOUS | Status: AC
  Filled 2017-10-21: qty 1

## 2017-10-21 MED ORDER — MEPERIDINE HCL 50 MG/ML IJ SOLN: 6.2500 mg | INTRAMUSCULAR | Status: DC | PRN

## 2017-10-21 MED ORDER — LACTATED RINGERS IV SOLN
INTRAVENOUS | Status: DC
  Administered 2017-10-21: 07:00:00 via INTRAVENOUS

## 2017-10-21 MED ORDER — BUPIVACAINE LIPOSOME 1.3 % IJ SUSP
INTRAMUSCULAR | Status: DC | PRN
  Administered 2017-10-21: 20 mL

## 2017-10-21 MED ORDER — LIDOCAINE HCL (PF) 1 % IJ SOLN
INTRAMUSCULAR | Status: AC
  Filled 2017-10-21: qty 5

## 2017-10-21 MED ORDER — EPHEDRINE SULFATE 50 MG/ML IJ SOLN
INTRAMUSCULAR | Status: DC | PRN
  Administered 2017-10-21: 5 mg via INTRAVENOUS
  Administered 2017-10-21: 10 mg via INTRAVENOUS

## 2017-10-21 MED ORDER — EPHEDRINE SULFATE 50 MG/ML IJ SOLN
INTRAMUSCULAR | Status: AC
  Filled 2017-10-21: qty 1

## 2017-10-21 MED ORDER — HYDROCODONE-ACETAMINOPHEN 5-325 MG PO TABS: 1.0000 | ORAL_TABLET | ORAL | 0 refills | Status: DC | PRN

## 2017-10-21 MED ORDER — FENTANYL CITRATE (PF) 100 MCG/2ML IJ SOLN
INTRAMUSCULAR | Status: DC | PRN
  Administered 2017-10-21 (×4): 25 ug via INTRAVENOUS

## 2017-10-21 MED ORDER — MIDAZOLAM HCL 5 MG/5ML IJ SOLN
INTRAMUSCULAR | Status: DC | PRN
  Administered 2017-10-21: 1 mg via INTRAVENOUS

## 2017-10-21 MED ORDER — FENTANYL CITRATE (PF) 250 MCG/5ML IJ SOLN
INTRAMUSCULAR | Status: AC
  Filled 2017-10-21: qty 5

## 2017-10-21 MED ORDER — HYDROMORPHONE HCL 1 MG/ML IJ SOLN: 0.2500 mg | INTRAMUSCULAR | Status: DC | PRN

## 2017-10-21 MED ORDER — SODIUM CHLORIDE 0.9 % IR SOLN
Status: DC | PRN
  Administered 2017-10-21: 1000 mL

## 2017-10-21 MED ORDER — LIDOCAINE HCL (CARDIAC) PF 50 MG/5ML IV SOSY
PREFILLED_SYRINGE | INTRAVENOUS | Status: DC | PRN
  Administered 2017-10-21: 40 mg via INTRAVENOUS

## 2017-10-21 MED ORDER — MIDAZOLAM HCL 2 MG/2ML IJ SOLN
INTRAMUSCULAR | Status: AC
  Filled 2017-10-21: qty 2

## 2017-10-21 MED ORDER — PHENYLEPHRINE 40 MCG/ML (10ML) SYRINGE FOR IV PUSH (FOR BLOOD PRESSURE SUPPORT)
PREFILLED_SYRINGE | INTRAVENOUS | Status: AC
  Filled 2017-10-21: qty 10

## 2017-10-21 MED ORDER — SODIUM CHLORIDE 0.9 % IJ SOLN
INTRAMUSCULAR | Status: AC
  Filled 2017-10-21: qty 10

## 2017-10-21 MED ORDER — KETOROLAC TROMETHAMINE 30 MG/ML IJ SOLN: 30.0000 mg | Freq: Once | INTRAMUSCULAR | Status: DC | PRN

## 2017-10-21 MED ORDER — PROPOFOL 10 MG/ML IV BOLUS
INTRAVENOUS | Status: DC | PRN
  Administered 2017-10-21: 150 mg via INTRAVENOUS

## 2017-10-21 SURGICAL SUPPLY — 43 items
ADH SKN CLS APL DERMABOND .7 (GAUZE/BANDAGES/DRESSINGS) ×1
CLOTH BEACON ORANGE TIMEOUT ST (SAFETY) ×2 IMPLANT
COVER LIGHT HANDLE STERIS (MISCELLANEOUS) ×4 IMPLANT
DERMABOND ADVANCED (GAUZE/BANDAGES/DRESSINGS) ×1
DERMABOND ADVANCED .7 DNX12 (GAUZE/BANDAGES/DRESSINGS) ×1 IMPLANT
DRAIN PENROSE 18X1/2 LTX STRL (DRAIN) ×2 IMPLANT
ELECT REM PT RETURN 9FT ADLT (ELECTROSURGICAL) ×2
ELECTRODE REM PT RTRN 9FT ADLT (ELECTROSURGICAL) ×1 IMPLANT
GAUZE SPONGE 4X4 12PLY STRL (GAUZE/BANDAGES/DRESSINGS) ×2 IMPLANT
GLOVE BIO SURGEON STRL SZ 6.5 (GLOVE) ×1 IMPLANT
GLOVE BIOGEL PI IND STRL 6.5 (GLOVE) IMPLANT
GLOVE BIOGEL PI IND STRL 7.0 (GLOVE) ×2 IMPLANT
GLOVE BIOGEL PI IND STRL 7.5 (GLOVE) IMPLANT
GLOVE BIOGEL PI INDICATOR 6.5 (GLOVE) ×1
GLOVE BIOGEL PI INDICATOR 7.0 (GLOVE) ×2
GLOVE BIOGEL PI INDICATOR 7.5 (GLOVE) ×1
GLOVE SURG SS PI 7.5 STRL IVOR (GLOVE) ×2 IMPLANT
GOWN STRL REUS W/ TWL XL LVL3 (GOWN DISPOSABLE) ×1 IMPLANT
GOWN STRL REUS W/TWL LRG LVL3 (GOWN DISPOSABLE) ×4 IMPLANT
GOWN STRL REUS W/TWL XL LVL3 (GOWN DISPOSABLE) ×2
INST SET MINOR GENERAL (KITS) ×2 IMPLANT
KIT TURNOVER KIT A (KITS) ×2 IMPLANT
MANIFOLD NEPTUNE II (INSTRUMENTS) ×2 IMPLANT
MESH HERNIA 1.6X1.9 PLUG LRG (Mesh General) IMPLANT
MESH HERNIA PLUG LRG (Mesh General) ×1 IMPLANT
NDL HYPO 21X1.5 SAFETY (NEEDLE) ×1 IMPLANT
NEEDLE HYPO 21X1.5 SAFETY (NEEDLE) ×2 IMPLANT
NS IRRIG 1000ML POUR BTL (IV SOLUTION) ×2 IMPLANT
PACK MINOR (CUSTOM PROCEDURE TRAY) ×2 IMPLANT
PAD ARMBOARD 7.5X6 YLW CONV (MISCELLANEOUS) ×2 IMPLANT
SET BASIN LINEN APH (SET/KITS/TRAYS/PACK) ×2 IMPLANT
SOL PREP PROV IODINE SCRUB 4OZ (MISCELLANEOUS) ×2 IMPLANT
SUT MNCRL AB 4-0 PS2 18 (SUTURE) ×3 IMPLANT
SUT NOVA NAB GS-22 2 2-0 T-19 (SUTURE) ×5 IMPLANT
SUT PROLENE 2 0 SH 30 (SUTURE) IMPLANT
SUT SILK 3 0 (SUTURE)
SUT SILK 3-0 18XBRD TIE 12 (SUTURE) IMPLANT
SUT VIC AB 2-0 CT1 27 (SUTURE) ×2
SUT VIC AB 2-0 CT1 TAPERPNT 27 (SUTURE) ×1 IMPLANT
SUT VIC AB 3-0 SH 27 (SUTURE) ×2
SUT VIC AB 3-0 SH 27X BRD (SUTURE) ×1 IMPLANT
SUT VICRYL AB 3 0 TIES (SUTURE) IMPLANT
SYR 20CC LL (SYRINGE) ×2 IMPLANT

## 2017-10-21 NOTE — Discharge Instructions (Signed)
General Anesthesia, Adult, Care After These instructions provide you with information about caring for yourself after your procedure. Your health care provider may also give you more specific instructions. Your treatment has been planned according to current medical practices, but problems sometimes occur. Call your health care provider if you have any problems or questions after your procedure. What can I expect after the procedure? After the procedure, it is common to have:  Vomiting.  A sore throat.  Mental slowness.  It is common to feel:  Nauseous.  Cold or shivery.  Sleepy.  Tired.  Sore or achy, even in parts of your body where you did not have surgery.  Follow these instructions at home: For at least 24 hours after the procedure:  Do not: ? Participate in activities where you could fall or become injured. ? Drive. ? Use heavy machinery. ? Drink alcohol. ? Take sleeping pills or medicines that cause drowsiness. ? Make important decisions or sign legal documents. ? Take care of children on your own.  Rest. Eating and drinking  If you vomit, drink water, juice, or soup when you can drink without vomiting.  Drink enough fluid to keep your urine clear or pale yellow.  Make sure you have little or no nausea before eating solid foods.  Follow the diet recommended by your health care provider. General instructions  Have a responsible adult stay with you until you are awake and alert.  Return to your normal activities as told by your health care provider. Ask your health care provider what activities are safe for you.  Take over-the-counter and prescription medicines only as told by your health care provider.  If you smoke, do not smoke without supervision.  Keep all follow-up visits as told by your health care provider. This is important. Contact a health care provider if:  You continue to have nausea or vomiting at home, and medicines are not helpful.  You  cannot drink fluids or start eating again.  You cannot urinate after 8-12 hours.  You develop a skin rash.  You have fever.  You have increasing redness at the site of your procedure. Get help right away if:  You have difficulty breathing.  You have chest pain.  You have unexpected bleeding.  You feel that you are having a life-threatening or urgent problem. This information is not intended to replace advice given to you by your health care provider. Make sure you discuss any questions you have with your health care provider. Document Released: 08/11/2000 Document Revised: 10/08/2015 Document Reviewed: 04/19/2015 Elsevier Interactive Patient Education  2018 [place] [employer], Adult, Care After This sheet gives you information about how to care for yourself after your procedure. Your health care provider may also give you more specific instructions. If you have problems or questions, contact your health care provider. What can I expect after the procedure? After the procedure, it is common to have:  Mild discomfort.  Slight bruising.  Minor swelling.  Pain in the abdomen.  Follow these instructions at home: Incision care   Follow instructions from your health care provider about how to take care of your incision area. Make sure you: ? Wash your hands with soap and water before you change your bandage (dressing). If soap and water are not available, use hand sanitizer. ? Change your dressing as told by your health care provider. ? Leave stitches (sutures), skin glue, or adhesive strips in place. These skin closures may need  to stay in place for 2 weeks or longer. If adhesive strip edges start to loosen and curl up, you may trim the loose edges. Do not remove adhesive strips completely unless your health care provider tells you to do that.  Check your incision area every day for signs of infection. Check for: ? More redness, swelling, or pain. ? More  fluid or blood. ? Warmth. ? Pus or a bad smell. Activity  Do not drive or use heavy machinery while taking prescription pain medicine. Do not drive until your health care provider approves.  Until your health care provider approves: ? Do not lift anything that is heavier than 10 lb (4.5 kg). ? Do not play contact sports.  Return to your normal activities as told by your health care provider. Ask your health care provider what activities are safe. General instructions  To prevent or treat constipation while you are taking prescription pain medicine, your health care provider may recommend that you: ? Drink enough fluid to keep your urine clear or pale yellow. ? Take over-the-counter or prescription medicines. ? Eat foods that are high in fiber, such as fresh fruits and vegetables, whole grains, and beans. ? Limit foods that are high in fat and processed sugars, such as fried and sweet foods.  Take over-the-counter and prescription medicines only as told by your health care provider.  Do not take tub baths or go swimming until your health care provider approves.  Keep all follow-up visits as told by your health care provider. This is important. Contact a health care provider if:  You develop a rash.  You have more redness, swelling, or pain around your incision.  You have more fluid or blood coming from your incision.  Your incision feels warm to the touch.  You have pus or a bad smell coming from your incision.  You have a fever or chills.  You have blood in your stool (feces).  You have not had a bowel movement in 2-3 days.  Your pain is not controlled with medicine. Get help right away if:  You have chest pain or shortness of breath.  You feel light-headed or feel faint.  You have severe pain.  You vomit and your pain is worse. This information is not intended to replace advice given to you by your health care provider. Make sure you discuss any questions you  have with your health care provider. Document Released: 11/22/2004 Document Revised: 11/23/2015 Document Reviewed: 10/17/2015 Elsevier Interactive Patient Education  2018 [employer].

## 2017-10-21 NOTE — Transfer of Care (Signed)
Immediate Anesthesia Transfer of Care Note  Patient: Jacob Nash  Procedure(s) Performed: HERNIA REPAIR INGUINAL ADULT WITH MESH (Right )  Patient Location: PACU  Anesthesia Type:General  Level of Consciousness: awake and alert   Airway & Oxygen Therapy: Patient Spontanous Breathing and Patient connected to nasal cannula oxygen  Post-op Assessment: Report given to RN  Post vital signs: Reviewed and stable  Last Vitals:  Vitals Value Taken Time  BP    Temp    Pulse 71 10/21/2017  8:40 AM  Resp 16 10/21/2017  8:40 AM  SpO2 94 % 10/21/2017  8:40 AM  Vitals shown include unvalidated device data.  Last Pain:  Vitals:   10/21/17 0638  TempSrc: Oral  PainSc: 3       Patients Stated Pain Goal: 6 (69/67/89 [id number])  Complications: No apparent anesthesia complications

## 2017-10-21 NOTE — Anesthesia Procedure Notes (Signed)
Procedure Name: LMA Insertion Date/Time: 10/21/2017 7:42 AM Performed by: Ollen Bowl, CRNA Pre-anesthesia Checklist: Patient identified, Patient being monitored, Emergency Drugs available, Timeout performed and Suction available Patient Re-evaluated:Patient Re-evaluated prior to induction Oxygen Delivery Method: Circle System Utilized Preoxygenation: Pre-oxygenation with 100% oxygen Induction Type: IV induction Ventilation: Mask ventilation without difficulty LMA: LMA inserted LMA Size: 4.0 Number of attempts: 1 Placement Confirmation: positive ETCO2 and breath sounds checked- equal and bilateral

## 2017-10-21 NOTE — Anesthesia Postprocedure Evaluation (Signed)
Anesthesia Post Note  Patient: Jacob Nash  Procedure(s) Performed: HERNIA REPAIR INGUINAL ADULT WITH MESH (Right )  Patient location during evaluation: PACU Anesthesia Type: General Level of consciousness: awake and alert and oriented Pain management: pain level controlled Vital Signs Assessment: post-procedure vital signs reviewed and stable Respiratory status: spontaneous breathing Cardiovascular status: blood pressure returned to baseline and stable Postop Assessment: no apparent nausea or vomiting Anesthetic complications: no     Last Vitals:  Vitals:   10/21/17 0900 10/21/17 0915  BP: 140/85 133/90  Pulse: 75 70  Resp: 14 13  Temp:    SpO2: 97% 95%    Last Pain:  Vitals:   10/21/17 0915  TempSrc:   PainSc: 3                  Chimere Klingensmith

## 2017-10-21 NOTE — Anesthesia Preprocedure Evaluation (Signed)
Anesthesia Evaluation  Patient identified by MRN, date of birth, ID band Patient awake    Reviewed: Allergy & Precautions, H&P , NPO status , Patient's Chart, lab work & pertinent test results, reviewed documented beta blocker date and time   History of Anesthesia Complications (+) PONV and history of anesthetic complications  Airway Mallampati: II  TM Distance: <3 FB Neck ROM: full   Comment: H/O tooth trauma Dental no notable dental hx.    Pulmonary neg pulmonary ROS,    Pulmonary exam normal breath sounds clear to auscultation       Cardiovascular Exercise Tolerance: Good hypertension, negative cardio ROS   Rhythm:regular Rate:Normal     Neuro/Psych Anxiety negative neurological ROS  negative psych ROS   GI/Hepatic negative GI ROS, Neg liver ROS, GERD  ,  Endo/Other  negative endocrine ROSdiabetes  Renal/GU negative Renal ROS   H/O prostate CA negative genitourinary   Musculoskeletal   Abdominal   Peds  Hematology negative hematology ROS (+)   Anesthesia Other Findings   Reproductive/Obstetrics negative OB ROS                             Anesthesia Physical Anesthesia Plan  ASA: III  Anesthesia Plan: General   Post-op Pain Management:    Induction:   PONV Risk Score and Plan:   Airway Management Planned:   Additional Equipment:   Intra-op Plan:   Post-operative Plan:   Informed Consent: I have reviewed the patients History and Physical, chart, labs and discussed the procedure including the risks, benefits and alternatives for the proposed anesthesia with the patient or authorized representative who has indicated his/her understanding and acceptance.   Dental Advisory Given  Plan Discussed with: CRNA  Anesthesia Plan Comments:         Anesthesia Quick Evaluation

## 2017-10-21 NOTE — Op Note (Signed)
Patient:  Jacob Nash  DOB:  August 29, 1945  MRN:  [id number]   Preop Diagnosis: Right inguinal hernia  Postop Diagnosis: Same  Procedure: Right inguinal herniorrhaphy with mesh  Surgeon: Aviva Signs, MD  Anes: General endotracheal  Indications: Patient is a 72 year old white male who presents with a symptomatic right inguinal hernia.  The risks and benefits of the procedure including bleeding, infection, mesh use, and the possibility of recurrence of the hernia were fully explained to the patient, who gave informed consent.  Procedure note: The patient was placed in supine position.  After induction of general endotracheal anesthesia, the right groin region was prepped and draped using the usual sterile technique with Betadine.  Surgical site confirmation was performed.  An incision was made in the right groin region down to the external oblique aponeurosis.  The aponeurosis was incised to the external ring.  A Penrose drain was placed around the spermatic cord.  The vase deferens was noted within the spermatic cord.  Patient had indirect hernia sac.  This was freed away from the spermatic cord up to the peritoneal reflection and inverted.  A large Bard PerFix plug was then inserted.  An onlay patch was then placed along the floor of the inguinal canal and secured superiorly to the conjoined tendon and inferiorly to the shelving edge of Poupart's ligament using 2-0 Novafil interrupted sutures.  The internal ring was re-created using a 2-0 Novafil interrupted suture.  Care was taken to avoid the ilioinguinal nerve.  The external oblique aponeurosis was reapproximated using a 2-0 Vicryl running suture.  The subcutaneous layer was reapproximated using 3-0 Vicryl interrupted suture.  Skin was closed using a 4-0 Monocryl subcuticular suture.  Exparel was instilled into the surrounding wound.  Dermabond was applied.  All tape and needle counts were correct at the end of the procedure.  The patient  was extubated in the operating room and transferred to the PACU in stable condition.  Complications: None  EBL: Minimal  Specimen: None

## 2017-10-21 NOTE — Interval H&P Note (Signed)
History and Physical Interval Note:  10/21/2017 7:12 AM  Jacob Nash  has presented today for surgery, with the diagnosis of right inguinal hernia  The various methods of treatment have been discussed with the patient and family. After consideration of risks, benefits and other options for treatment, the patient has consented to  Procedure(s): HERNIA REPAIR INGUINAL ADULT WITH MESH (Right) as a surgical intervention .  The patient's history has been reviewed, patient examined, no change in status, stable for surgery.  I have reviewed the patient's chart and labs.  Questions were answered to the patient's satisfaction.     Aviva Signs

## 2017-10-22 ENCOUNTER — Encounter (HOSPITAL_COMMUNITY): Payer: Self-pay | Admitting: General Surgery

## 2017-10-29 ENCOUNTER — Other Ambulatory Visit: Payer: Self-pay

## 2017-11-03 ENCOUNTER — Ambulatory Visit (INDEPENDENT_AMBULATORY_CARE_PROVIDER_SITE_OTHER): Payer: Self-pay | Admitting: General Surgery

## 2017-11-03 ENCOUNTER — Encounter: Payer: Self-pay | Admitting: General Surgery

## 2017-11-03 VITALS — BP 135/83 | HR 76 | Temp 97.5°F | Resp 20 | Ht 71.0 in | Wt 206.0 lb

## 2017-11-03 DIAGNOSIS — Z09 Encounter for follow-up examination after completed treatment for conditions other than malignant neoplasm: Secondary | ICD-10-CM

## 2017-11-03 NOTE — Progress Notes (Signed)
Subjective:     Jacob Nash  Status post recurrent right inguinal herniorrhaphy with mesh.  Patient doing very well.  Denies any significant pain.  Is gradually returning to normal activity. Objective:    BP 135/83   Pulse 76   Temp (!) 97.5 F (36.4 C) (Oral)   Resp 20   Ht 5\' 11"  (1.803 m)   Wt 206 lb (93.4 kg)   BMI 28.73 kg/m   General:  alert, cooperative and no distress  Abdomen soft, incision healing well.     Assessment:    Doing well postoperatively.    Plan:   Increase activity as able.  Follow-up here as needed.

## 2017-11-23 DIAGNOSIS — D485 Neoplasm of uncertain behavior of skin: Secondary | ICD-10-CM | POA: Diagnosis not present

## 2017-11-23 DIAGNOSIS — L570 Actinic keratosis: Secondary | ICD-10-CM | POA: Diagnosis not present

## 2017-11-23 DIAGNOSIS — L309 Dermatitis, unspecified: Secondary | ICD-10-CM | POA: Diagnosis not present

## 2017-12-02 DIAGNOSIS — E782 Mixed hyperlipidemia: Secondary | ICD-10-CM | POA: Diagnosis not present

## 2017-12-02 DIAGNOSIS — I10 Essential (primary) hypertension: Secondary | ICD-10-CM | POA: Diagnosis not present

## 2017-12-02 DIAGNOSIS — E119 Type 2 diabetes mellitus without complications: Secondary | ICD-10-CM | POA: Diagnosis not present

## 2017-12-02 DIAGNOSIS — K210 Gastro-esophageal reflux disease with esophagitis: Secondary | ICD-10-CM | POA: Diagnosis not present

## 2017-12-04 DIAGNOSIS — E782 Mixed hyperlipidemia: Secondary | ICD-10-CM | POA: Diagnosis not present

## 2017-12-04 DIAGNOSIS — K210 Gastro-esophageal reflux disease with esophagitis: Secondary | ICD-10-CM | POA: Diagnosis not present

## 2017-12-04 DIAGNOSIS — K5909 Other constipation: Secondary | ICD-10-CM | POA: Diagnosis not present

## 2017-12-04 DIAGNOSIS — C61 Malignant neoplasm of prostate: Secondary | ICD-10-CM | POA: Diagnosis not present

## 2017-12-04 DIAGNOSIS — R21 Rash and other nonspecific skin eruption: Secondary | ICD-10-CM | POA: Diagnosis not present

## 2017-12-04 DIAGNOSIS — Z0001 Encounter for general adult medical examination with abnormal findings: Secondary | ICD-10-CM | POA: Diagnosis not present

## 2017-12-04 DIAGNOSIS — I10 Essential (primary) hypertension: Secondary | ICD-10-CM | POA: Diagnosis not present

## 2017-12-04 DIAGNOSIS — Z6828 Body mass index (BMI) 28.0-28.9, adult: Secondary | ICD-10-CM | POA: Diagnosis not present

## 2017-12-04 DIAGNOSIS — E1121 Type 2 diabetes mellitus with diabetic nephropathy: Secondary | ICD-10-CM | POA: Diagnosis not present

## 2017-12-09 DIAGNOSIS — N5201 Erectile dysfunction due to arterial insufficiency: Secondary | ICD-10-CM | POA: Diagnosis not present

## 2017-12-09 DIAGNOSIS — C61 Malignant neoplasm of prostate: Secondary | ICD-10-CM | POA: Diagnosis not present

## 2018-01-05 DIAGNOSIS — M1811 Unilateral primary osteoarthritis of first carpometacarpal joint, right hand: Secondary | ICD-10-CM | POA: Diagnosis not present

## 2018-01-05 DIAGNOSIS — M19031 Primary osteoarthritis, right wrist: Secondary | ICD-10-CM | POA: Diagnosis not present

## 2018-02-01 DIAGNOSIS — L42 Pityriasis rosea: Secondary | ICD-10-CM | POA: Diagnosis not present

## 2018-02-08 DIAGNOSIS — Z23 Encounter for immunization: Secondary | ICD-10-CM | POA: Diagnosis not present

## 2018-02-26 ENCOUNTER — Encounter: Payer: Self-pay | Admitting: Internal Medicine

## 2018-03-30 DIAGNOSIS — M545 Low back pain: Secondary | ICD-10-CM | POA: Diagnosis not present

## 2018-03-30 DIAGNOSIS — M1711 Unilateral primary osteoarthritis, right knee: Secondary | ICD-10-CM | POA: Diagnosis not present

## 2018-03-30 DIAGNOSIS — M25562 Pain in left knee: Secondary | ICD-10-CM | POA: Diagnosis not present

## 2018-04-06 ENCOUNTER — Encounter: Payer: Self-pay | Admitting: Internal Medicine

## 2018-04-06 ENCOUNTER — Ambulatory Visit (INDEPENDENT_AMBULATORY_CARE_PROVIDER_SITE_OTHER): Payer: Medicare Other | Admitting: Internal Medicine

## 2018-04-06 VITALS — BP 119/80 | HR 79 | Temp 97.0°F | Ht 71.0 in | Wt 214.8 lb

## 2018-04-06 DIAGNOSIS — K5909 Other constipation: Secondary | ICD-10-CM | POA: Diagnosis not present

## 2018-04-06 DIAGNOSIS — K219 Gastro-esophageal reflux disease without esophagitis: Secondary | ICD-10-CM | POA: Diagnosis not present

## 2018-04-06 NOTE — Progress Notes (Signed)
Primary Care Physician:  Manon Hilding, MD Primary Gastroenterologist:  Dr. Gala Romney  Pre-Procedure History & Physical: HPI:  Jacob Nash is a 72 y.o. male here for follow-up of GERD and constipation.  Patient doing very well with Benefiber and applesauce daily along with as needed prune juice/MiraLAX.  History of colonic adenoma.  Due for surveillance examination in 4 years.  GERD well-controlled on omeprazole 20 mg daily.  No dysphagia.  Past Medical History:  Diagnosis Date  . Anxiety   . Arthritis   . Cancer [facility]) MARCH 2017   PROSTATE   . Diabetes mellitus without complication ([place])   . Elevated cholesterol   . Frequent urination   . GERD (gastroesophageal reflux disease)   . Hemorrhoids   . Hypertension   . Joint pain   . PONV (postoperative nausea and vomiting)    NAUSEA ONLY    Past Surgical History:  Procedure Laterality Date  . [place] AND 1996   LOWER BACK  . COLONOSCOPY N/A 07/03/2017   Dr. Gala Romney: Long redundant colon, diverticulosis, next colonoscopy in 5 years given personal history of colon polyps  . ESOPHAGOGASTRODUODENOSCOPY N/A 07/03/2017   Dr. Gala Romney: Small hiatal hernia  . HERNIA REPAIR  03-21-2009    LEFT INGUINAL  . INCISIONAL HERNIA REPAIR N/A 08/11/2016   Procedure: Fatima Blank HERNIORRHAPHY  WITH MESH;  Surgeon: Aviva Signs, MD;  Location: [facility];  Service: General;  Laterality: N/A;  . INGUINAL HERNIA REPAIR Right 10/21/2017   Procedure: HERNIA REPAIR INGUINAL ADULT WITH MESH;  Surgeon: Aviva Signs, MD;  Location: [facility];  Service: General;  Laterality: Right;  . KNEE ARTHROSCOPY    . LYMPHADENECTOMY Bilateral 10/11/2015   Procedure: BILATERAL PELVIC LYMPHADENECTOMY;  Surgeon: Raynelle Bring, MD;  Location: [facility];  Service: Urology;  Laterality: Bilateral;  . PROSTATE BIOPSY  07-2015  . PROSTATE SURGERY  09/2015  . ROBOT ASSISTED LAPAROSCOPIC RADICAL PROSTATECTOMY N/A 10/11/2015   Procedure: XI ROBOTIC ASSISTED LAPAROSCOPIC RADICAL  PROSTATECTOMY LEVEL 2;  Surgeon: Raynelle Bring, MD;  Location: [facility];  Service: Urology;  Laterality: N/A;  . SHOULDER SURGERY Right 06-06-14   MUSCLE TEAR    Prior to Admission medications   Medication Sig Start Date End Date Taking? Authorizing Provider  acetaminophen (TYLENOL) 500 MG tablet Take 500 mg by mouth every 6 (six) hours as needed for moderate pain or headache.    Yes [provider]  ALPRAZolam Duanne Moron) 0.5 MG tablet Take 0.5 mg by mouth 2 (two) times daily. Mid-afternoon & 1 hour before bedtime. 09/19/15  Yes [provider]  aspirin EC 81 MG tablet Take 81 mg by mouth daily after supper.   Yes [provider]  Cholecalciferol (VITAMIN D) 2000 units tablet Take 2,000 Units by mouth daily after lunch.   Yes [provider]  fluticasone (FLONASE) 50 MCG/ACT nasal spray Place 2 sprays into both nostrils daily.  09/12/15  Yes [provider]  lisinopril (PRINIVIL,ZESTRIL) 40 MG tablet Take 40 mg by mouth daily after lunch.  09/03/15  Yes [provider]  loratadine (CLARITIN) 10 MG tablet Take 10 mg by mouth daily.    Yes [provider]  Multiple Vitamins-Minerals (MULTIVITAMIN PO) Take 1 tablet by mouth daily after lunch.    Yes [provider]  Omega-3 Fatty Acids (FISH OIL) 1200 MG CAPS Take 2,400 mg by mouth daily after lunch.   Yes [provider]  omeprazole (PRILOSEC) 20 MG capsule Take 20 mg  by mouth daily. For acid reflux/indigestion not resolved by Zantac 09/09/17  Yes [provider]  ONE TOUCH ULTRA TEST test strip TEST DAILY DX E11.21 10/28/17  Yes [provider]  polyethylene glycol (MIRALAX / GLYCOLAX) packet Take 17 g by mouth daily as needed.   Yes [provider]  simvastatin (ZOCOR) 20 MG tablet Take 10 mg by mouth daily after supper.  09/03/15  Yes [provider]  triamcinolone cream (KENALOG) 0.1 % Apply 1 application topically 2 (two) times daily as  needed (for dry skin patches/itchy skin).  05/17/16  Yes [provider]  Wheat Dextrin (BENEFIBER) POWD Take by mouth 2 (two) times daily. 1 tablespoon twice a day   Yes [provider]  ranitidine (ZANTAC) 150 MG tablet Take 150 mg by mouth 2 (two) times daily. 05/15/17   [provider]    Allergies as of 04/06/2018  . (No Known Allergies)    No family history on file.  Social History   Socioeconomic History  . Marital status: Married    Spouse name: Not on file  . Number of children: Not on file  . Years of education: Not on file  . Highest education level: Not on file  Occupational History  . Not on file  Social Needs  . Financial resource strain: Not on file  . Food insecurity:    Worry: Not on file    Inability: Not on file  . Transportation needs:    Medical: Not on file    Non-medical: Not on file  Tobacco Use  . Smoking status: Never Smoker  . Smokeless tobacco: Never Used  Substance and Sexual Activity  . Alcohol use: No  . Drug use: No  . Sexual activity: Yes    Birth control/protection: None  Lifestyle  . Physical activity:    Days per week: Not on file    Minutes per session: Not on file  . Stress: Not on file  Relationships  . Social connections:    Talks on phone: Not on file    Gets together: Not on file    Attends religious service: Not on file    Active member of club or organization: Not on file    Attends meetings of clubs or organizations: Not on file    Relationship status: Not on file  . Intimate partner violence:    Fear of current or ex partner: Not on file    Emotionally abused: Not on file    Physically abused: Not on file    Forced sexual activity: Not on file  Other Topics Concern  . Not on file  Social History Narrative  . Not on file    Review of Systems: See HPI, otherwise negative ROS  Physical Exam: BP 119/80   Pulse 79   Temp (!) 97 F (36.1 C) (Oral)   Ht 5\' 11"  (1.803 m)   Wt 214 lb  12.8 oz (97.4 kg)   BMI 29.96 kg/m  General:   Alert,   pleasant and cooperative in NAD Neck:  Supple; no masses or thyromegaly. No significant cervical adenopathy. Lungs:  Clear throughout to auscultation.   No wheezes, crackles, or rhonchi. No acute distress. Heart:  Regular rate and rhythm; no murmurs, clicks, rubs,  or gallops. Abdomen: Non-distended, normal bowel sounds.  Soft and nontender without appreciable mass or hepatosplenomegaly.  Pulses:  Normal pulses noted. Extremities:  Without clubbing or edema.  Impression/Plan: Very pleasant 72 year old gentleman with chronic constipation.  History of diverticulosis colonic adenoma.  Constipation doing very well at this time.  He is slated for 1 more colonoscopy in about 4 years depending on overall health.  GERD symptoms well controlled on omeprazole 20 mg daily.  Continue omeprazole 20 mg daily  Continue Benefiber daily (in apple sauce OK)  May use Miralax as needed  Repeat colonoscopy in 4 years.   Follow-up here as needed     Notice: This dictation was prepared with Dragon dictation along with smaller phrase technology. Any transcriptional errors that result from this process are unintentional and may not be corrected upon review.

## 2018-04-06 NOTE — Patient Instructions (Signed)
Continue omeprazole 20 mg daily  Continue Benefiber daily (in apple sauce OK)  May use Miralax as needed  Repeat colonoscopy in 4 years.   Follow-up here as needed    Thank you for entrusting me with your care. I appreciate the opportunity to create valuable relationships with patients and family members. You may receive a questionnaire in the mail regarding your visit here today.  It would be appreciated if you would take the time to return it.  If you were not completely satisfied with your experience, I would love to discuss any concerns with you.   Bridgette Habermann, M.D. [facility]

## 2018-04-22 DIAGNOSIS — H0230 Blepharochalasis unspecified eye, unspecified eyelid: Secondary | ICD-10-CM | POA: Diagnosis not present

## 2018-04-22 DIAGNOSIS — H2513 Age-related nuclear cataract, bilateral: Secondary | ICD-10-CM | POA: Diagnosis not present

## 2018-04-22 DIAGNOSIS — E119 Type 2 diabetes mellitus without complications: Secondary | ICD-10-CM | POA: Diagnosis not present

## 2018-04-22 DIAGNOSIS — H5203 Hypermetropia, bilateral: Secondary | ICD-10-CM | POA: Diagnosis not present

## 2018-06-04 DIAGNOSIS — Z6829 Body mass index (BMI) 29.0-29.9, adult: Secondary | ICD-10-CM | POA: Diagnosis not present

## 2018-06-04 DIAGNOSIS — K210 Gastro-esophageal reflux disease with esophagitis: Secondary | ICD-10-CM | POA: Diagnosis not present

## 2018-06-04 DIAGNOSIS — C61 Malignant neoplasm of prostate: Secondary | ICD-10-CM | POA: Diagnosis not present

## 2018-06-04 DIAGNOSIS — E1121 Type 2 diabetes mellitus with diabetic nephropathy: Secondary | ICD-10-CM | POA: Diagnosis not present

## 2018-06-04 DIAGNOSIS — I10 Essential (primary) hypertension: Secondary | ICD-10-CM | POA: Diagnosis not present

## 2018-06-04 DIAGNOSIS — E782 Mixed hyperlipidemia: Secondary | ICD-10-CM | POA: Diagnosis not present

## 2018-06-04 DIAGNOSIS — J3089 Other allergic rhinitis: Secondary | ICD-10-CM | POA: Diagnosis not present

## 2018-06-04 DIAGNOSIS — K5909 Other constipation: Secondary | ICD-10-CM | POA: Diagnosis not present

## 2018-06-16 DIAGNOSIS — C61 Malignant neoplasm of prostate: Secondary | ICD-10-CM | POA: Diagnosis not present

## 2018-06-16 DIAGNOSIS — N5201 Erectile dysfunction due to arterial insufficiency: Secondary | ICD-10-CM | POA: Diagnosis not present

## 2018-07-03 DIAGNOSIS — R69 Illness, unspecified: Secondary | ICD-10-CM | POA: Diagnosis not present

## 2018-07-14 DIAGNOSIS — R69 Illness, unspecified: Secondary | ICD-10-CM | POA: Diagnosis not present

## 2018-08-03 DIAGNOSIS — R69 Illness, unspecified: Secondary | ICD-10-CM | POA: Diagnosis not present

## 2018-08-19 DIAGNOSIS — R69 Illness, unspecified: Secondary | ICD-10-CM | POA: Diagnosis not present

## 2018-10-06 DIAGNOSIS — R69 Illness, unspecified: Secondary | ICD-10-CM | POA: Diagnosis not present

## 2018-11-23 DIAGNOSIS — R69 Illness, unspecified: Secondary | ICD-10-CM | POA: Diagnosis not present

## 2018-11-24 DIAGNOSIS — R69 Illness, unspecified: Secondary | ICD-10-CM | POA: Diagnosis not present

## 2018-11-24 DIAGNOSIS — D1801 Hemangioma of skin and subcutaneous tissue: Secondary | ICD-10-CM | POA: Diagnosis not present

## 2018-11-24 DIAGNOSIS — E1121 Type 2 diabetes mellitus with diabetic nephropathy: Secondary | ICD-10-CM | POA: Diagnosis not present

## 2018-11-24 DIAGNOSIS — I10 Essential (primary) hypertension: Secondary | ICD-10-CM | POA: Diagnosis not present

## 2018-11-24 DIAGNOSIS — L570 Actinic keratosis: Secondary | ICD-10-CM | POA: Diagnosis not present

## 2018-11-24 DIAGNOSIS — K210 Gastro-esophageal reflux disease with esophagitis: Secondary | ICD-10-CM | POA: Diagnosis not present

## 2018-11-24 DIAGNOSIS — K5732 Diverticulitis of large intestine without perforation or abscess without bleeding: Secondary | ICD-10-CM | POA: Diagnosis not present

## 2018-11-24 DIAGNOSIS — E782 Mixed hyperlipidemia: Secondary | ICD-10-CM | POA: Diagnosis not present

## 2018-11-24 DIAGNOSIS — L821 Other seborrheic keratosis: Secondary | ICD-10-CM | POA: Diagnosis not present

## 2018-12-01 DIAGNOSIS — C61 Malignant neoplasm of prostate: Secondary | ICD-10-CM | POA: Diagnosis not present

## 2018-12-01 DIAGNOSIS — K5909 Other constipation: Secondary | ICD-10-CM | POA: Diagnosis not present

## 2018-12-01 DIAGNOSIS — E782 Mixed hyperlipidemia: Secondary | ICD-10-CM | POA: Diagnosis not present

## 2018-12-01 DIAGNOSIS — I10 Essential (primary) hypertension: Secondary | ICD-10-CM | POA: Diagnosis not present

## 2018-12-01 DIAGNOSIS — Z0001 Encounter for general adult medical examination with abnormal findings: Secondary | ICD-10-CM | POA: Diagnosis not present

## 2018-12-01 DIAGNOSIS — K210 Gastro-esophageal reflux disease with esophagitis: Secondary | ICD-10-CM | POA: Diagnosis not present

## 2018-12-01 DIAGNOSIS — Z6828 Body mass index (BMI) 28.0-28.9, adult: Secondary | ICD-10-CM | POA: Diagnosis not present

## 2018-12-01 DIAGNOSIS — E1121 Type 2 diabetes mellitus with diabetic nephropathy: Secondary | ICD-10-CM | POA: Diagnosis not present

## 2019-01-14 DIAGNOSIS — N5201 Erectile dysfunction due to arterial insufficiency: Secondary | ICD-10-CM | POA: Diagnosis not present

## 2019-01-14 DIAGNOSIS — C61 Malignant neoplasm of prostate: Secondary | ICD-10-CM | POA: Diagnosis not present

## 2019-01-17 DIAGNOSIS — E7800 Pure hypercholesterolemia, unspecified: Secondary | ICD-10-CM | POA: Diagnosis not present

## 2019-01-17 DIAGNOSIS — E1165 Type 2 diabetes mellitus with hyperglycemia: Secondary | ICD-10-CM | POA: Diagnosis not present

## 2019-01-17 DIAGNOSIS — I10 Essential (primary) hypertension: Secondary | ICD-10-CM | POA: Diagnosis not present

## 2019-01-31 DIAGNOSIS — Z23 Encounter for immunization: Secondary | ICD-10-CM | POA: Diagnosis not present

## 2019-04-07 DIAGNOSIS — R69 Illness, unspecified: Secondary | ICD-10-CM | POA: Diagnosis not present

## 2019-04-18 DIAGNOSIS — I10 Essential (primary) hypertension: Secondary | ICD-10-CM | POA: Diagnosis not present

## 2019-05-19 DIAGNOSIS — H0231 Blepharochalasis right upper eyelid: Secondary | ICD-10-CM | POA: Diagnosis not present

## 2019-05-19 DIAGNOSIS — E109 Type 1 diabetes mellitus without complications: Secondary | ICD-10-CM | POA: Diagnosis not present

## 2019-05-19 DIAGNOSIS — E7800 Pure hypercholesterolemia, unspecified: Secondary | ICD-10-CM | POA: Diagnosis not present

## 2019-05-19 DIAGNOSIS — H0234 Blepharochalasis left upper eyelid: Secondary | ICD-10-CM | POA: Diagnosis not present

## 2019-05-19 DIAGNOSIS — H2513 Age-related nuclear cataract, bilateral: Secondary | ICD-10-CM | POA: Diagnosis not present

## 2019-05-19 DIAGNOSIS — I10 Essential (primary) hypertension: Secondary | ICD-10-CM | POA: Diagnosis not present

## 2019-05-19 DIAGNOSIS — Z0100 Encounter for examination of eyes and vision without abnormal findings: Secondary | ICD-10-CM | POA: Diagnosis not present

## 2019-05-24 DIAGNOSIS — R69 Illness, unspecified: Secondary | ICD-10-CM | POA: Diagnosis not present

## 2019-05-26 DIAGNOSIS — R69 Illness, unspecified: Secondary | ICD-10-CM | POA: Diagnosis not present

## 2019-05-30 DIAGNOSIS — E782 Mixed hyperlipidemia: Secondary | ICD-10-CM | POA: Diagnosis not present

## 2019-05-30 DIAGNOSIS — E1121 Type 2 diabetes mellitus with diabetic nephropathy: Secondary | ICD-10-CM | POA: Diagnosis not present

## 2019-05-30 DIAGNOSIS — I10 Essential (primary) hypertension: Secondary | ICD-10-CM | POA: Diagnosis not present

## 2019-05-30 DIAGNOSIS — K2100 Gastro-esophageal reflux disease with esophagitis, without bleeding: Secondary | ICD-10-CM | POA: Diagnosis not present

## 2019-05-30 DIAGNOSIS — E7800 Pure hypercholesterolemia, unspecified: Secondary | ICD-10-CM | POA: Diagnosis not present

## 2019-06-02 DIAGNOSIS — R69 Illness, unspecified: Secondary | ICD-10-CM | POA: Diagnosis not present

## 2019-06-02 DIAGNOSIS — E1121 Type 2 diabetes mellitus with diabetic nephropathy: Secondary | ICD-10-CM | POA: Diagnosis not present

## 2019-06-02 DIAGNOSIS — Z6829 Body mass index (BMI) 29.0-29.9, adult: Secondary | ICD-10-CM | POA: Diagnosis not present

## 2019-06-02 DIAGNOSIS — C61 Malignant neoplasm of prostate: Secondary | ICD-10-CM | POA: Diagnosis not present

## 2019-06-02 DIAGNOSIS — K5909 Other constipation: Secondary | ICD-10-CM | POA: Diagnosis not present

## 2019-06-02 DIAGNOSIS — E782 Mixed hyperlipidemia: Secondary | ICD-10-CM | POA: Diagnosis not present

## 2019-06-02 DIAGNOSIS — I10 Essential (primary) hypertension: Secondary | ICD-10-CM | POA: Diagnosis not present

## 2019-06-17 DIAGNOSIS — E7849 Other hyperlipidemia: Secondary | ICD-10-CM | POA: Diagnosis not present

## 2019-06-17 DIAGNOSIS — I10 Essential (primary) hypertension: Secondary | ICD-10-CM | POA: Diagnosis not present

## 2019-07-12 DIAGNOSIS — R69 Illness, unspecified: Secondary | ICD-10-CM | POA: Diagnosis not present

## 2019-07-13 DIAGNOSIS — R69 Illness, unspecified: Secondary | ICD-10-CM | POA: Diagnosis not present

## 2019-07-13 DIAGNOSIS — Z008 Encounter for other general examination: Secondary | ICD-10-CM | POA: Diagnosis not present

## 2019-07-13 DIAGNOSIS — E119 Type 2 diabetes mellitus without complications: Secondary | ICD-10-CM | POA: Diagnosis not present

## 2019-07-13 DIAGNOSIS — E785 Hyperlipidemia, unspecified: Secondary | ICD-10-CM | POA: Diagnosis not present

## 2019-07-13 DIAGNOSIS — E669 Obesity, unspecified: Secondary | ICD-10-CM | POA: Diagnosis not present

## 2019-07-13 DIAGNOSIS — Z791 Long term (current) use of non-steroidal anti-inflammatories (NSAID): Secondary | ICD-10-CM | POA: Diagnosis not present

## 2019-07-13 DIAGNOSIS — J309 Allergic rhinitis, unspecified: Secondary | ICD-10-CM | POA: Diagnosis not present

## 2019-07-13 DIAGNOSIS — Z7982 Long term (current) use of aspirin: Secondary | ICD-10-CM | POA: Diagnosis not present

## 2019-07-13 DIAGNOSIS — K219 Gastro-esophageal reflux disease without esophagitis: Secondary | ICD-10-CM | POA: Diagnosis not present

## 2019-07-13 DIAGNOSIS — M1990 Unspecified osteoarthritis, unspecified site: Secondary | ICD-10-CM | POA: Diagnosis not present

## 2019-07-13 DIAGNOSIS — I10 Essential (primary) hypertension: Secondary | ICD-10-CM | POA: Diagnosis not present

## 2019-07-15 DIAGNOSIS — E7849 Other hyperlipidemia: Secondary | ICD-10-CM | POA: Diagnosis not present

## 2019-07-15 DIAGNOSIS — I10 Essential (primary) hypertension: Secondary | ICD-10-CM | POA: Diagnosis not present

## 2019-07-22 DIAGNOSIS — C61 Malignant neoplasm of prostate: Secondary | ICD-10-CM | POA: Diagnosis not present

## 2019-07-22 DIAGNOSIS — N5201 Erectile dysfunction due to arterial insufficiency: Secondary | ICD-10-CM | POA: Diagnosis not present

## 2019-08-17 DIAGNOSIS — I10 Essential (primary) hypertension: Secondary | ICD-10-CM | POA: Diagnosis not present

## 2019-08-17 DIAGNOSIS — E7849 Other hyperlipidemia: Secondary | ICD-10-CM | POA: Diagnosis not present

## 2019-08-29 DIAGNOSIS — R69 Illness, unspecified: Secondary | ICD-10-CM | POA: Diagnosis not present

## 2019-09-16 DIAGNOSIS — I10 Essential (primary) hypertension: Secondary | ICD-10-CM | POA: Diagnosis not present

## 2019-09-16 DIAGNOSIS — E7849 Other hyperlipidemia: Secondary | ICD-10-CM | POA: Diagnosis not present

## 2019-09-16 DIAGNOSIS — K2100 Gastro-esophageal reflux disease with esophagitis, without bleeding: Secondary | ICD-10-CM | POA: Diagnosis not present

## 2019-09-30 DIAGNOSIS — K0500 Acute gingivitis, plaque induced: Secondary | ICD-10-CM | POA: Diagnosis not present

## 2019-09-30 DIAGNOSIS — K068 Other specified disorders of gingiva and edentulous alveolar ridge: Secondary | ICD-10-CM | POA: Diagnosis not present

## 2019-09-30 DIAGNOSIS — R220 Localized swelling, mass and lump, head: Secondary | ICD-10-CM | POA: Diagnosis not present

## 2019-10-11 DIAGNOSIS — M25551 Pain in right hip: Secondary | ICD-10-CM | POA: Diagnosis not present

## 2019-10-11 DIAGNOSIS — M5441 Lumbago with sciatica, right side: Secondary | ICD-10-CM | POA: Diagnosis not present

## 2019-10-11 DIAGNOSIS — M25552 Pain in left hip: Secondary | ICD-10-CM | POA: Diagnosis not present

## 2019-10-11 DIAGNOSIS — M545 Low back pain: Secondary | ICD-10-CM | POA: Diagnosis not present

## 2019-10-16 DIAGNOSIS — R69 Illness, unspecified: Secondary | ICD-10-CM | POA: Diagnosis not present

## 2019-11-01 ENCOUNTER — Other Ambulatory Visit: Payer: Self-pay | Admitting: Orthopedic Surgery

## 2019-11-01 ENCOUNTER — Other Ambulatory Visit: Payer: Self-pay

## 2019-11-01 DIAGNOSIS — M545 Low back pain, unspecified: Secondary | ICD-10-CM

## 2019-11-01 DIAGNOSIS — M5441 Lumbago with sciatica, right side: Secondary | ICD-10-CM | POA: Diagnosis not present

## 2019-11-14 DIAGNOSIS — L821 Other seborrheic keratosis: Secondary | ICD-10-CM | POA: Diagnosis not present

## 2019-11-14 DIAGNOSIS — L814 Other melanin hyperpigmentation: Secondary | ICD-10-CM | POA: Diagnosis not present

## 2019-11-14 DIAGNOSIS — L570 Actinic keratosis: Secondary | ICD-10-CM | POA: Diagnosis not present

## 2019-11-14 DIAGNOSIS — D1801 Hemangioma of skin and subcutaneous tissue: Secondary | ICD-10-CM | POA: Diagnosis not present

## 2019-11-16 DIAGNOSIS — E1121 Type 2 diabetes mellitus with diabetic nephropathy: Secondary | ICD-10-CM | POA: Diagnosis not present

## 2019-11-16 DIAGNOSIS — I10 Essential (primary) hypertension: Secondary | ICD-10-CM | POA: Diagnosis not present

## 2019-11-16 DIAGNOSIS — E7849 Other hyperlipidemia: Secondary | ICD-10-CM | POA: Diagnosis not present

## 2019-11-16 DIAGNOSIS — K2100 Gastro-esophageal reflux disease with esophagitis, without bleeding: Secondary | ICD-10-CM | POA: Diagnosis not present

## 2019-11-21 DIAGNOSIS — K2100 Gastro-esophageal reflux disease with esophagitis, without bleeding: Secondary | ICD-10-CM | POA: Diagnosis not present

## 2019-11-21 DIAGNOSIS — E1121 Type 2 diabetes mellitus with diabetic nephropathy: Secondary | ICD-10-CM | POA: Diagnosis not present

## 2019-11-21 DIAGNOSIS — E782 Mixed hyperlipidemia: Secondary | ICD-10-CM | POA: Diagnosis not present

## 2019-11-21 DIAGNOSIS — I10 Essential (primary) hypertension: Secondary | ICD-10-CM | POA: Diagnosis not present

## 2019-11-21 DIAGNOSIS — E7800 Pure hypercholesterolemia, unspecified: Secondary | ICD-10-CM | POA: Diagnosis not present

## 2019-11-28 DIAGNOSIS — I10 Essential (primary) hypertension: Secondary | ICD-10-CM | POA: Diagnosis not present

## 2019-11-28 DIAGNOSIS — E1121 Type 2 diabetes mellitus with diabetic nephropathy: Secondary | ICD-10-CM | POA: Diagnosis not present

## 2019-11-28 DIAGNOSIS — Z6829 Body mass index (BMI) 29.0-29.9, adult: Secondary | ICD-10-CM | POA: Diagnosis not present

## 2019-11-28 DIAGNOSIS — E782 Mixed hyperlipidemia: Secondary | ICD-10-CM | POA: Diagnosis not present

## 2019-11-28 DIAGNOSIS — R69 Illness, unspecified: Secondary | ICD-10-CM | POA: Diagnosis not present

## 2019-11-28 DIAGNOSIS — R1011 Right upper quadrant pain: Secondary | ICD-10-CM | POA: Diagnosis not present

## 2019-12-04 DIAGNOSIS — R69 Illness, unspecified: Secondary | ICD-10-CM | POA: Diagnosis not present

## 2019-12-07 ENCOUNTER — Other Ambulatory Visit: Payer: Self-pay

## 2019-12-07 ENCOUNTER — Ambulatory Visit
Admission: RE | Admit: 2019-12-07 | Discharge: 2019-12-07 | Disposition: A | Discharge status: Home / Self Care | Payer: Medicare HMO | Source: Ambulatory Visit | Attending: Orthopedic Surgery | Admitting: Orthopedic Surgery

## 2019-12-07 DIAGNOSIS — M48061 Spinal stenosis, lumbar region without neurogenic claudication: Secondary | ICD-10-CM | POA: Diagnosis not present

## 2019-12-07 DIAGNOSIS — M545 Low back pain, unspecified: Secondary | ICD-10-CM
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
## 2019-12-15 DIAGNOSIS — M5441 Lumbago with sciatica, right side: Secondary | ICD-10-CM | POA: Diagnosis not present

## 2019-12-15 DIAGNOSIS — M25551 Pain in right hip: Secondary | ICD-10-CM | POA: Diagnosis not present

## 2019-12-15 DIAGNOSIS — M545 Low back pain: Secondary | ICD-10-CM | POA: Diagnosis not present

## 2019-12-22 DIAGNOSIS — K8020 Calculus of gallbladder without cholecystitis without obstruction: Secondary | ICD-10-CM | POA: Diagnosis not present

## 2019-12-22 DIAGNOSIS — R932 Abnormal findings on diagnostic imaging of liver and biliary tract: Secondary | ICD-10-CM | POA: Diagnosis not present

## 2019-12-22 DIAGNOSIS — N1330 Unspecified hydronephrosis: Secondary | ICD-10-CM | POA: Diagnosis not present

## 2019-12-26 DIAGNOSIS — Z6828 Body mass index (BMI) 28.0-28.9, adult: Secondary | ICD-10-CM | POA: Diagnosis not present

## 2019-12-26 DIAGNOSIS — K8020 Calculus of gallbladder without cholecystitis without obstruction: Secondary | ICD-10-CM | POA: Diagnosis not present

## 2019-12-26 DIAGNOSIS — L299 Pruritus, unspecified: Secondary | ICD-10-CM | POA: Diagnosis not present

## 2019-12-26 DIAGNOSIS — R110 Nausea: Secondary | ICD-10-CM | POA: Diagnosis not present

## 2019-12-30 DIAGNOSIS — M5416 Radiculopathy, lumbar region: Secondary | ICD-10-CM | POA: Diagnosis not present

## 2020-01-05 ENCOUNTER — Encounter: Payer: Self-pay | Admitting: General Surgery

## 2020-01-05 ENCOUNTER — Ambulatory Visit: Payer: Medicare HMO | Admitting: General Surgery

## 2020-01-05 ENCOUNTER — Other Ambulatory Visit: Payer: Self-pay

## 2020-01-05 VITALS — BP 136/89 | HR 85 | Temp 98.1°F | Resp 12 | Ht 70.5 in | Wt 211.0 lb

## 2020-01-05 DIAGNOSIS — K8020 Calculus of gallbladder without cholecystitis without obstruction: Secondary | ICD-10-CM

## 2020-01-05 NOTE — Patient Instructions (Signed)
Laparoscopic Cholecystectomy Laparoscopic cholecystectomy is surgery to remove the gallbladder. The gallbladder is a pear-shaped organ that lies beneath the liver on the right side of the body. The gallbladder stores bile, which is a fluid that helps the body to digest fats. Cholecystectomy is often done for inflammation of the gallbladder (cholecystitis). This condition is usually caused by a buildup of gallstones (cholelithiasis) in the gallbladder. Gallstones can block the flow of bile, which can result in inflammation and pain. In severe cases, emergency surgery may be required. This procedure is done though small incisions in your abdomen (laparoscopic surgery). A thin scope with a camera (laparoscope) is inserted through one incision. Thin surgical instruments are inserted through the other incisions. In some cases, a laparoscopic procedure may be turned into a type of surgery that is done through a larger incision (open surgery). Tell a health care provider about:  Any allergies you have.  All medicines you are taking, including vitamins, herbs, eye drops, creams, and over-the-counter medicines.  Any problems you or family members have had with anesthetic medicines.  Any blood disorders you have.  Any surgeries you have had.  Any medical conditions you have.  Whether you are pregnant or may be pregnant. What are the risks? Generally, this is a safe procedure. However, problems may occur, including:  Infection.  Bleeding.  Allergic reactions to medicines.  Damage to other structures or organs.  A stone remaining in the common bile duct. The common bile duct carries bile from the gallbladder into the small intestine.  A bile leak from the cyst duct that is clipped when your gallbladder is removed. What happens before the procedure?   Medicines  Ask your health care provider about: ? Changing or stopping your regular medicines. This is especially important if you are taking  diabetes medicines or blood thinners. ? Taking medicines such as aspirin and ibuprofen. These medicines can thin your blood. Do not take these medicines before your procedure if your health care provider instructs you not to.  You may be given antibiotic medicine to help prevent infection. General instructions  Let your health care provider know if you develop a cold or an infection before surgery.  Plan to have someone take you home from the hospital or clinic.  Ask your health care provider how your surgical site will be marked or identified. What happens during the procedure?   To reduce your risk of infection: ? Your health care team will wash or sanitize their hands. ? Your skin will be washed with soap. ? Hair may be removed from the surgical area.  An IV tube may be inserted into one of your veins.  You will be given one or more of the following: ? A medicine to help you relax (sedative). ? A medicine to make you fall asleep (general anesthetic).  A breathing tube will be placed in your mouth.  Your surgeon will make several small cuts (incisions) in your abdomen.  The laparoscope will be inserted through one of the small incisions. The camera on the laparoscope will send images to a TV screen (monitor) in the operating room. This lets your surgeon see inside your abdomen.  Air-like gas will be pumped into your abdomen. This will expand your abdomen to give the surgeon more room to perform the surgery.  Other tools that are needed for the procedure will be inserted through the other incisions. The gallbladder will be removed through one of the incisions.  Your common bile duct   may be examined. If stones are found in the common bile duct, they may be removed.  After your gallbladder has been removed, the incisions will be closed with stitches (sutures), staples, or skin glue.  Your incisions may be covered with a bandage (dressing). The procedure may vary among health  care providers and hospitals. What happens after the procedure?  Your blood pressure, heart rate, breathing rate, and blood oxygen level will be monitored until the medicines you were given have worn off.  You will be given medicines as needed to control your pain.  Do not drive for 24 hours if you were given a sedative. This information is not intended to replace advice given to you by your health care provider. Make sure you discuss any questions you have with your health care provider. Document Revised: 04/17/2017 Document Reviewed: 10/22/2015 Elsevier Patient Education  2020 [employer]  

## 2020-01-06 NOTE — H&P (Signed)
Jacob Nash; [id number]; 05-11-1946   HPI Patient is a 74 year old white male who was referred to my care by Dr. Quintin Alto for evaluation and treatment of cholelithiasis.  Patient has a known history of cholelithiasis dating back many years.  He states that recently he is starting to have increasing right upper quadrant pain and nausea.  He denies any fever, chills, jaundice.  He does have intermittent fatty food intolerance. Past Medical History:  Diagnosis Date  . Anxiety   . Arthritis   . Cancer [facility]) MARCH 2017   PROSTATE   . Diabetes mellitus without complication ([place])   . Elevated cholesterol   . Frequent urination   . GERD (gastroesophageal reflux disease)   . Hemorrhoids   . Hypertension   . Joint pain   . PONV (postoperative nausea and vomiting)    NAUSEA ONLY    Past Surgical History:  Procedure Laterality Date  . [place] AND 1996   LOWER BACK  . COLONOSCOPY N/A 07/03/2017   Dr. Gala Romney: Long redundant colon, diverticulosis, next colonoscopy in 5 years given personal history of colon polyps  . ESOPHAGOGASTRODUODENOSCOPY N/A 07/03/2017   Dr. Gala Romney: Small hiatal hernia  . HERNIA REPAIR  03-21-2009    LEFT INGUINAL  . INCISIONAL HERNIA REPAIR N/A 08/11/2016   Procedure: Fatima Blank HERNIORRHAPHY  WITH MESH;  Surgeon: Aviva Signs, MD;  Location: [facility];  Service: General;  Laterality: N/A;  . INGUINAL HERNIA REPAIR Right 10/21/2017   Procedure: HERNIA REPAIR INGUINAL ADULT WITH MESH;  Surgeon: Aviva Signs, MD;  Location: [facility];  Service: General;  Laterality: Right;  . KNEE ARTHROSCOPY    . LYMPHADENECTOMY Bilateral 10/11/2015   Procedure: BILATERAL PELVIC LYMPHADENECTOMY;  Surgeon: Raynelle Bring, MD;  Location: [facility];  Service: Urology;  Laterality: Bilateral;  . PROSTATE BIOPSY  07-2015  . PROSTATE SURGERY  09/2015  . ROBOT ASSISTED LAPAROSCOPIC RADICAL PROSTATECTOMY N/A 10/11/2015   Procedure: XI ROBOTIC ASSISTED LAPAROSCOPIC RADICAL PROSTATECTOMY LEVEL 2;   Surgeon: Raynelle Bring, MD;  Location: [facility];  Service: Urology;  Laterality: N/A;  . SHOULDER SURGERY Right 06-06-14   MUSCLE TEAR    History reviewed. No pertinent family history.  Current Outpatient Medications on File Prior to Visit  Medication Sig Dispense Refill  . acetaminophen (TYLENOL) 500 MG tablet Take 500 mg by mouth every 6 (six) hours as needed for moderate pain or headache.     . ALPRAZolam (XANAX) 0.5 MG tablet Take 0.5 mg by mouth 2 (two) times daily. Mid-afternoon & 1 hour before bedtime.  5  . aspirin EC 81 MG tablet Take 81 mg by mouth daily after supper.    . Cholecalciferol (VITAMIN D) 2000 units tablet Take 2,000 Units by mouth daily after lunch.    . fluticasone (FLONASE) 50 MCG/ACT nasal spray Place 2 sprays into both nostrils daily.   10  . lisinopril (PRINIVIL,ZESTRIL) 40 MG tablet Take 40 mg by mouth daily after lunch.   12  . loratadine (CLARITIN) 10 MG tablet Take 10 mg by mouth daily.     . Multiple Vitamins-Minerals (MULTIVITAMIN PO) Take 1 tablet by mouth daily after lunch.     . Omega-3 Fatty Acids (FISH OIL) 1200 MG CAPS Take 2,400 mg by mouth daily after lunch.    Marland Kitchen omeprazole (PRILOSEC) 20 MG capsule Take 20 mg by mouth daily. For acid reflux/indigestion not resolved by Zantac  12  . ONE TOUCH ULTRA TEST test strip TEST DAILY DX E11.21  5  .  polyethylene glycol (MIRALAX / GLYCOLAX) packet Take 17 g by mouth daily as needed.    . ranitidine (ZANTAC) 150 MG tablet Take 150 mg by mouth 2 (two) times daily.  12  . simvastatin (ZOCOR) 20 MG tablet Take 10 mg by mouth daily after supper.   12  . triamcinolone cream (KENALOG) 0.1 % Apply 1 application topically 2 (two) times daily as needed (for dry skin patches/itchy skin).     . Wheat Dextrin (BENEFIBER) POWD Take by mouth 2 (two) times daily. 1 tablespoon twice a day     No current facility-administered medications on file prior to visit.    No Known Allergies  Social History   Substance and Sexual  Activity  Alcohol Use No    Social History   Tobacco Use  Smoking Status Never Smoker  Smokeless Tobacco Never Used    Review of Systems  Constitutional: Negative.   HENT: Negative.   Eyes: Negative.   Respiratory: Negative.   Cardiovascular: Negative.   Gastrointestinal: Positive for abdominal pain and nausea.  Genitourinary: Negative.   Skin: Positive for itching.  Neurological: Negative.   Endo/Heme/Allergies: Negative.   Psychiatric/Behavioral: Negative.     Objective   Vitals:   01/05/20 1038  BP: 136/89  Pulse: 85  Resp: 12  Temp: 98.1 F (36.7 C)  SpO2: 96%    Physical Exam Vitals reviewed.  Constitutional:      Appearance: Normal appearance. He is not ill-appearing.  HENT:     Head: Normocephalic and atraumatic.  Eyes:     General: No scleral icterus. Cardiovascular:     Rate and Rhythm: Normal rate and regular rhythm.     Heart sounds: Normal heart sounds. No murmur heard.  No friction rub. No gallop.   Pulmonary:     Effort: Pulmonary effort is normal. No respiratory distress.     Breath sounds: Normal breath sounds. No stridor. No wheezing, rhonchi or rales.  Abdominal:     General: Bowel sounds are normal. There is no distension.     Palpations: Abdomen is soft. There is no mass.     Tenderness: There is abdominal tenderness. There is no guarding or rebound.     Hernia: No hernia is present.     Comments: Mild discomfort to palpation in right upper quadrant.  Skin:    General: Skin is warm and dry.  Neurological:     Mental Status: He is alert and oriented to person, place, and time.   PCP notes reviewed.  Assessment  Biliary colic, cholelithiasis Plan   Patient scheduled for laparoscopic cholecystectomy on 01/18/2020.  The risks and benefits of the procedure including bleeding, infection, hepatobiliary injury, and the possibility of an open procedure were fully explained to the patient, who gave informed consent.

## 2020-01-06 NOTE — Progress Notes (Signed)
Jacob Nash; [id number]; 03-06-1946   HPI Patient is a 74 year old white male who was referred to my care by Dr. Quintin Alto for evaluation and treatment of cholelithiasis.  Patient has a known history of cholelithiasis dating back many years.  He states that recently he is starting to have increasing right upper quadrant pain and nausea.  He denies any fever, chills, jaundice.  He does have intermittent fatty food intolerance. Past Medical History:  Diagnosis Date  . Anxiety   . Arthritis   . Cancer [facility]) MARCH 2017   PROSTATE   . Diabetes mellitus without complication ([place])   . Elevated cholesterol   . Frequent urination   . GERD (gastroesophageal reflux disease)   . Hemorrhoids   . Hypertension   . Joint pain   . PONV (postoperative nausea and vomiting)    NAUSEA ONLY    Past Surgical History:  Procedure Laterality Date  . [place] AND 1996   LOWER BACK  . COLONOSCOPY N/A 07/03/2017   Dr. Gala Romney: Long redundant colon, diverticulosis, next colonoscopy in 5 years given personal history of colon polyps  . ESOPHAGOGASTRODUODENOSCOPY N/A 07/03/2017   Dr. Gala Romney: Small hiatal hernia  . HERNIA REPAIR  03-21-2009    LEFT INGUINAL  . INCISIONAL HERNIA REPAIR N/A 08/11/2016   Procedure: Fatima Blank HERNIORRHAPHY  WITH MESH;  Surgeon: Aviva Signs, MD;  Location: [facility];  Service: General;  Laterality: N/A;  . INGUINAL HERNIA REPAIR Right 10/21/2017   Procedure: HERNIA REPAIR INGUINAL ADULT WITH MESH;  Surgeon: Aviva Signs, MD;  Location: [facility];  Service: General;  Laterality: Right;  . KNEE ARTHROSCOPY    . LYMPHADENECTOMY Bilateral 10/11/2015   Procedure: BILATERAL PELVIC LYMPHADENECTOMY;  Surgeon: Raynelle Bring, MD;  Location: [facility];  Service: Urology;  Laterality: Bilateral;  . PROSTATE BIOPSY  07-2015  . PROSTATE SURGERY  09/2015  . ROBOT ASSISTED LAPAROSCOPIC RADICAL PROSTATECTOMY N/A 10/11/2015   Procedure: XI ROBOTIC ASSISTED LAPAROSCOPIC RADICAL PROSTATECTOMY LEVEL 2;   Surgeon: Raynelle Bring, MD;  [facility];  Service: Urology;  Laterality: N/A;  . SHOULDER SURGERY Right 06-06-14   MUSCLE TEAR    History reviewed. No pertinent family history.  Current Outpatient Medications on File Prior to Visit  Medication Sig Dispense Refill  . acetaminophen (TYLENOL) 500 MG tablet Take 500 mg by mouth every 6 (six) hours as needed for moderate pain or headache.     . ALPRAZolam (XANAX) 0.5 MG tablet Take 0.5 mg by mouth 2 (two) times daily. Mid-afternoon & 1 hour before bedtime.  5  . aspirin EC 81 MG tablet Take 81 mg by mouth daily after supper.    . Cholecalciferol (VITAMIN D) 2000 units tablet Take 2,000 Units by mouth daily after lunch.    . fluticasone (FLONASE) 50 MCG/ACT nasal spray Place 2 sprays into both nostrils daily.   10  . lisinopril (PRINIVIL,ZESTRIL) 40 MG tablet Take 40 mg by mouth daily after lunch.   12  . loratadine (CLARITIN) 10 MG tablet Take 10 mg by mouth daily.     . Multiple Vitamins-Minerals (MULTIVITAMIN PO) Take 1 tablet by mouth daily after lunch.     . Omega-3 Fatty Acids (FISH OIL) 1200 MG CAPS Take 2,400 mg by mouth daily after lunch.    Marland Kitchen omeprazole (PRILOSEC) 20 MG capsule Take 20 mg by mouth daily. For acid reflux/indigestion not resolved by Zantac  12  . ONE TOUCH ULTRA TEST test strip TEST DAILY DX E11.21  5  .  polyethylene glycol (MIRALAX / GLYCOLAX) packet Take 17 g by mouth daily as needed.    . ranitidine (ZANTAC) 150 MG tablet Take 150 mg by mouth 2 (two) times daily.  12  . simvastatin (ZOCOR) 20 MG tablet Take 10 mg by mouth daily after supper.   12  . triamcinolone cream (KENALOG) 0.1 % Apply 1 application topically 2 (two) times daily as needed (for dry skin patches/itchy skin).     . Wheat Dextrin (BENEFIBER) POWD Take by mouth 2 (two) times daily. 1 tablespoon twice a day     No current facility-administered medications on file prior to visit.    No Known Allergies  Social History   Substance and Sexual  Activity  Alcohol Use No    Social History   Tobacco Use  Smoking Status Never Smoker  Smokeless Tobacco Never Used    Review of Systems  Constitutional: Negative.   HENT: Negative.   Eyes: Negative.   Respiratory: Negative.   Cardiovascular: Negative.   Gastrointestinal: Positive for abdominal pain and nausea.  Genitourinary: Negative.   Skin: Positive for itching.  Neurological: Negative.   Endo/Heme/Allergies: Negative.   Psychiatric/Behavioral: Negative.     Objective   Vitals:   01/05/20 1038  BP: 136/89  Pulse: 85  Resp: 12  Temp: 98.1 F (36.7 C)  SpO2: 96%    Physical Exam Vitals reviewed.  Constitutional:      Appearance: Normal appearance. He is not ill-appearing.  HENT:     Head: Normocephalic and atraumatic.  Eyes:     General: No scleral icterus. Cardiovascular:     Rate and Rhythm: Normal rate and regular rhythm.     Heart sounds: Normal heart sounds. No murmur heard.  No friction rub. No gallop.   Pulmonary:     Effort: Pulmonary effort is normal. No respiratory distress.     Breath sounds: Normal breath sounds. No stridor. No wheezing, rhonchi or rales.  Abdominal:     General: Bowel sounds are normal. There is no distension.     Palpations: Abdomen is soft. There is no mass.     Tenderness: There is abdominal tenderness. There is no guarding or rebound.     Hernia: No hernia is present.     Comments: Mild discomfort to palpation in right upper quadrant.  Skin:    General: Skin is warm and dry.  Neurological:     Mental Status: He is alert and oriented to person, place, and time.   PCP notes reviewed.  Assessment  Biliary colic, cholelithiasis Plan   Patient scheduled for laparoscopic cholecystectomy on 01/18/2020.  The risks and benefits of the procedure including bleeding, infection, hepatobiliary injury, and the possibility of an open procedure were fully explained to the patient, who gave informed consent.

## 2020-01-12 DIAGNOSIS — R69 Illness, unspecified: Secondary | ICD-10-CM | POA: Diagnosis not present

## 2020-01-13 NOTE — Patient Instructions (Signed)
Jacob Nash  01/13/2020     @PREFPERIOPPHARMACY @   Your procedure is scheduled on  01/18/2020.  Report to [facility] at  [phone]  A.M.  Call this number if you have problems the morning of surgery:  [phone]   Remember:  Do not eat or drink after midnight.                      Take these medicines the morning of surgery with A SIP OF WATER  Xanax(if needed), prilosec.    Do not wear jewelry, make-up or nail polish.  Do not wear lotions, powders, or perfumes. Please wear deodorant and brush your teeth.  Do not shave 48 hours prior to surgery.  Men may shave face and neck.  Do not bring valuables to the hospital.  [facility] is not responsible for any belongings or valuables.  Contacts, dentures or bridgework may not be worn into surgery.  Leave your suitcase in the car.  After surgery it may be brought to your room.  For patients admitted to the hospital, discharge time will be determined by your treatment team.  Patients discharged the day of surgery will not be allowed to drive home.   Name and phone number of your driver:   family Special instructions:  DO NOT smoke the morning of your procedure.  Please read over the following fact sheets that you were given. Anesthesia Post-op Instructions and Care and Recovery After Surgery       Laparoscopic Cholecystectomy, Care After This sheet gives you information about how to care for yourself after your procedure. Your health care provider may also give you more specific instructions. If you have problems or questions, contact your health care provider. What can I expect after the procedure? After the procedure, it is common to have:  Pain at your incision sites. You will be given medicines to control this pain.  Mild nausea or vomiting.  Bloating and possible shoulder pain from the air-like gas that was used during the procedure. Follow these instructions at home: Incision care   Follow instructions  from your health care provider about how to take care of your incisions. Make sure you: ? Wash your hands with soap and water before you change your bandage (dressing). If soap and water are not available, use hand sanitizer. ? Change your dressing as told by your health care provider. ? Leave stitches (sutures), skin glue, or adhesive strips in place. These skin closures may need to be in place for 2 weeks or longer. If adhesive strip edges start to loosen and curl up, you may trim the loose edges. Do not remove adhesive strips completely unless your health care provider tells you to do that.  Do not take baths, swim, or use a hot tub until your health care provider approves. Ask your health care provider if you can take showers. You may only be allowed to take sponge baths for bathing.  Check your incision area every day for signs of infection. Check for: ? More redness, swelling, or pain. ? More fluid or blood. ? Warmth. ? Pus or a bad smell. Activity  Do not drive or use heavy machinery while taking prescription pain medicine.  Do not lift anything that is heavier than 10 lb (4.5 kg) until your health care provider approves.  Do not play contact sports until your health care provider approves.  Do not drive for 24  hours if you were given a medicine to help you relax (sedative).  Rest as needed. Do not return to work or school until your health care provider approves. General instructions  Take over-the-counter and prescription medicines only as told by your health care provider.  To prevent or treat constipation while you are taking prescription pain medicine, your health care provider may recommend that you: ? Drink enough fluid to keep your urine clear or pale yellow. ? Take over-the-counter or prescription medicines. ? Eat foods that are high in fiber, such as fresh fruits and vegetables, whole grains, and beans. ? Limit foods that are high in fat and processed sugars, such as  fried and sweet foods. Contact a health care provider if:  You develop a rash.  You have more redness, swelling, or pain around your incisions.  You have more fluid or blood coming from your incisions.  Your incisions feel warm to the touch.  You have pus or a bad smell coming from your incisions.  You have a fever.  One or more of your incisions breaks open. Get help right away if:  You have trouble breathing.  You have chest pain.  You have increasing pain in your shoulders.  You faint or feel dizzy when you stand.  You have severe pain in your abdomen.  You have nausea or vomiting that lasts for more than one day.  You have leg pain. This information is not intended to replace advice given to you by your health care provider. Make sure you discuss any questions you have with your health care provider. Document Revised: 04/17/2017 Document Reviewed: 10/22/2015 Elsevier Patient Education  2020 [place] Anesthesia, Adult, Care After This sheet gives you information about how to care for yourself after your procedure. Your health care provider may also give you more specific instructions. If you have problems or questions, contact your health care provider. What can I expect after the procedure? After the procedure, the following side effects are common:  Pain or discomfort at the IV site.  Nausea.  Vomiting.  Sore throat.  Trouble concentrating.  Feeling cold or chills.  Weak or tired.  Sleepiness and fatigue.  Soreness and body aches. These side effects can affect parts of the body that were not involved in surgery. Follow these instructions at home:  For at least 24 hours after the procedure:  Have a responsible adult stay with you. It is important to have someone help care for you until you are awake and alert.  Rest as needed.  Do not: ? Participate in activities in which you could fall or become injured. ? Drive. ? Use heavy  machinery. ? Drink alcohol. ? Take sleeping pills or medicines that cause drowsiness. ? Make important decisions or sign legal documents. ? Take care of children on your own. Eating and drinking  Follow any instructions from your health care provider about eating or drinking restrictions.  When you feel hungry, start by eating small amounts of foods that are soft and easy to digest (bland), such as toast. Gradually return to your regular diet.  Drink enough fluid to keep your urine pale yellow.  If you vomit, rehydrate by drinking water, juice, or clear broth. General instructions  If you have sleep apnea, surgery and certain medicines can increase your risk for breathing problems. Follow instructions from your health care provider about wearing your sleep device: ? Anytime you are sleeping, including during daytime naps. ? While taking prescription  pain medicines, sleeping medicines, or medicines that make you drowsy.  Return to your normal activities as told by your health care provider. Ask your health care provider what activities are safe for you.  Take over-the-counter and prescription medicines only as told by your health care provider.  If you smoke, do not smoke without supervision.  Keep all follow-up visits as told by your health care provider. This is important. Contact a health care provider if:  You have nausea or vomiting that does not get better with medicine.  You cannot eat or drink without vomiting.  You have pain that does not get better with medicine.  You are unable to pass urine.  You develop a skin rash.  You have a fever.  You have redness around your IV site that gets worse. Get help right away if:  You have difficulty breathing.  You have chest pain.  You have blood in your urine or stool, or you vomit blood. Summary  After the procedure, it is common to have a sore throat or nausea. It is also common to feel tired.  Have a responsible  adult stay with you for the first 24 hours after general anesthesia. It is important to have someone help care for you until you are awake and alert.  When you feel hungry, start by eating small amounts of foods that are soft and easy to digest (bland), such as toast. Gradually return to your regular diet.  Drink enough fluid to keep your urine pale yellow.  Return to your normal activities as told by your health care provider. Ask your health care provider what activities are safe for you. This information is not intended to replace advice given to you by your health care provider. Make sure you discuss any questions you have with your health care provider. Document Revised: 05/08/2017 Document Reviewed: 12/19/2016 Elsevier Patient Education  [place]. How to Use Chlorhexidine for Bathing Chlorhexidine gluconate (CHG) is a germ-killing (antiseptic) solution that is used to clean the skin. It can get rid of the bacteria that normally live on the skin and can keep them away for about 24 hours. To clean your skin with CHG, you may be given:  A CHG solution to use in the shower or as part of a sponge bath.  A prepackaged cloth that contains CHG. Cleaning your skin with CHG may help lower the risk for infection:  While you are staying in the intensive care unit of the hospital.  If you have a vascular access, such as a central line, to provide short-term or long-term access to your veins.  If you have a catheter to drain urine from your bladder.  If you are on a ventilator. A ventilator is a machine that helps you breathe by moving air in and out of your lungs.  After surgery. What are the risks? Risks of using CHG include:  A skin reaction.  Hearing loss, if CHG gets in your ears.  Eye injury, if CHG gets in your eyes and is not rinsed out.  The CHG product catching fire. Make sure that you avoid smoking and flames after applying CHG to your skin. Do not use CHG:  If  you have a chlorhexidine allergy or have previously reacted to chlorhexidine.  On babies younger than 58 months of age. How to use CHG solution  Use CHG only as told by your health care provider, and follow the instructions on the label.  Use the full amount  of CHG as directed. Usually, this is one bottle. During a shower Follow these steps when using CHG solution during a shower (unless your health care provider gives you different instructions): 1. Start the shower. 2. Use your normal soap and shampoo to wash your face and hair. 3. Turn off the shower or move out of the shower stream. 4. Pour the CHG onto a clean washcloth. Do not use any type of brush or rough-edged sponge. 5. Starting at your neck, lather your body down to your toes. Make sure you follow these instructions: ? If you will be having surgery, pay special attention to the part of your body where you will be having surgery. Scrub this area for at least 1 minute. ? Do not use CHG on your head or face. If the solution gets into your ears or eyes, rinse them well with water. ? Avoid your genital area. ? Avoid any areas of skin that have broken skin, cuts, or scrapes. ? Scrub your back and under your arms. Make sure to wash skin folds. 6. Let the lather sit on your skin for 1-2 minutes or as long as told by your health care provider. 7. Thoroughly rinse your entire body in the shower. Make sure that all body creases and crevices are rinsed well. 8. Dry off with a clean towel. Do not put any substances on your body afterward--such as powder, lotion, or perfume--unless you are told to do so by your health care provider. Only use lotions that are recommended by the manufacturer. 9. Put on clean clothes or pajamas. 10. If it is the night before your surgery, sleep in clean sheets.  During a sponge bath Follow these steps when using CHG solution during a sponge bath (unless your health care provider gives you different  instructions): 1. Use your normal soap and shampoo to wash your face and hair. 2. Pour the CHG onto a clean washcloth. 3. Starting at your neck, lather your body down to your toes. Make sure you follow these instructions: ? If you will be having surgery, pay special attention to the part of your body where you will be having surgery. Scrub this area for at least 1 minute. ? Do not use CHG on your head or face. If the solution gets into your ears or eyes, rinse them well with water. ? Avoid your genital area. ? Avoid any areas of skin that have broken skin, cuts, or scrapes. ? Scrub your back and under your arms. Make sure to wash skin folds. 4. Let the lather sit on your skin for 1-2 minutes or as long as told by your health care provider. 5. Using a different clean, wet washcloth, thoroughly rinse your entire body. Make sure that all body creases and crevices are rinsed well. 6. Dry off with a clean towel. Do not put any substances on your body afterward--such as powder, lotion, or perfume--unless you are told to do so by your health care provider. Only use lotions that are recommended by the manufacturer. 7. Put on clean clothes or pajamas. 8. If it is the night before your surgery, sleep in clean sheets. How to use CHG prepackaged cloths  Only use CHG cloths as told by your health care provider, and follow the instructions on the label.  Use the CHG cloth on clean, dry skin.  Do not use the CHG cloth on your head or face unless your health care provider tells you to.  When washing with the CHG  cloth: ? Avoid your genital area. ? Avoid any areas of skin that have broken skin, cuts, or scrapes. Before surgery Follow these steps when using a CHG cloth to clean before surgery (unless your health care provider gives you different instructions): 1. Using the CHG cloth, vigorously scrub the part of your body where you will be having surgery. Scrub using a back-and-forth motion for 3 minutes.  The area on your body should be completely wet with CHG when you are done scrubbing. 2. Do not rinse. Discard the cloth and let the area air-dry. Do not put any substances on the area afterward, such as powder, lotion, or perfume. 3. Put on clean clothes or pajamas. 4. If it is the night before your surgery, sleep in clean sheets.  For general bathing Follow these steps when using CHG cloths for general bathing (unless your health care provider gives you different instructions). 1. Use a separate CHG cloth for each area of your body. Make sure you wash between any folds of skin and between your fingers and toes. Wash your body in the following order, switching to a new cloth after each step: ? The front of your neck, shoulders, and chest. ? Both of your arms, under your arms, and your hands. ? Your stomach and groin area, avoiding the genitals. ? Your right leg and foot. ? Your left leg and foot. ? The back of your neck, your back, and your buttocks. 2. Do not rinse. Discard the cloth and let the area air-dry. Do not put any substances on your body afterward--such as powder, lotion, or perfume--unless you are told to do so by your health care provider. Only use lotions that are recommended by the manufacturer. 3. Put on clean clothes or pajamas. Contact a health care provider if:  Your skin gets irritated after scrubbing.  You have questions about using your solution or cloth. Get help right away if:  Your eyes become very red or swollen.  Your eyes itch badly.  Your skin itches badly and is red or swollen.  Your hearing changes.  You have trouble seeing.  You have swelling or tingling in your mouth or throat.  You have trouble breathing.  You swallow any chlorhexidine. Summary  Chlorhexidine gluconate (CHG) is a germ-killing (antiseptic) solution that is used to clean the skin. Cleaning your skin with CHG may help to lower your risk for infection.  You may be given CHG to  use for bathing. It may be in a bottle or in a prepackaged cloth to use on your skin. Carefully follow your health care provider's instructions and the instructions on the product label.  Do not use CHG if you have a chlorhexidine allergy.  Contact your health care provider if your skin gets irritated after scrubbing. This information is not intended to replace advice given to you by your health care provider. Make sure you discuss any questions you have with your health care provider. Document Revised: 07/22/2018 Document Reviewed: 04/02/2017 Elsevier Patient Education  [place].

## 2020-01-17 ENCOUNTER — Encounter (HOSPITAL_COMMUNITY)
Admission: RE | Admit: 2020-01-17 | Discharge: 2020-01-17 | Disposition: A | Discharge status: Home / Self Care | Payer: Medicare HMO | Source: Ambulatory Visit | Attending: General Surgery | Admitting: General Surgery

## 2020-01-17 ENCOUNTER — Encounter (HOSPITAL_COMMUNITY): Payer: Self-pay

## 2020-01-17 ENCOUNTER — Other Ambulatory Visit: Payer: Self-pay

## 2020-01-17 ENCOUNTER — Other Ambulatory Visit (HOSPITAL_COMMUNITY)
Admission: RE | Admit: 2020-01-17 | Discharge: 2020-01-17 | Disposition: A | Discharge status: Home / Self Care | Payer: Medicare HMO | Source: Ambulatory Visit | Attending: General Surgery | Admitting: General Surgery

## 2020-01-17 DIAGNOSIS — E7849 Other hyperlipidemia: Secondary | ICD-10-CM | POA: Diagnosis not present

## 2020-01-17 DIAGNOSIS — K2100 Gastro-esophageal reflux disease with esophagitis, without bleeding: Secondary | ICD-10-CM | POA: Diagnosis not present

## 2020-01-17 DIAGNOSIS — Z01818 Encounter for other preprocedural examination: Secondary | ICD-10-CM | POA: Diagnosis not present

## 2020-01-17 DIAGNOSIS — E1121 Type 2 diabetes mellitus with diabetic nephropathy: Secondary | ICD-10-CM | POA: Diagnosis not present

## 2020-01-17 DIAGNOSIS — I10 Essential (primary) hypertension: Secondary | ICD-10-CM | POA: Diagnosis not present

## 2020-01-17 DIAGNOSIS — Z20822 Contact with and (suspected) exposure to covid-19: Secondary | ICD-10-CM | POA: Insufficient documentation

## 2020-01-17 LAB — COMPREHENSIVE METABOLIC PANEL
ALT: 27 U/L (ref 0–44)
AST: 25 U/L (ref 15–41)
Albumin: 3.8 g/dL (ref 3.5–5.0)
Alkaline Phosphatase: 46 U/L (ref 38–126)
Anion gap: 8 (ref 5–15)
BUN: 12 mg/dL (ref 8–23)
CO2: 30 mmol/L (ref 22–32)
Calcium: 9.4 mg/dL (ref 8.9–10.3)
Chloride: 102 mmol/L (ref 98–111)
Creatinine, Ser: 0.75 mg/dL (ref 0.61–1.24)
GFR calc Af Amer: >60 mL/min (ref >60)
GFR calc non Af Amer: >60 mL/min (ref >60)
Glucose, Bld: 117 mg/dL — ABNORMAL HIGH (ref 70–99)
Potassium: 4.1 mmol/L (ref 3.5–5.1)
Sodium: 140 mmol/L (ref 135–145)
Total Bilirubin: 1.7 mg/dL — ABNORMAL HIGH (ref 0.3–1.2)
Total Protein: 6.5 g/dL (ref 6.5–8.1)

## 2020-01-17 LAB — CBC WITH DIFFERENTIAL/PLATELET
Abs Immature Granulocytes: 0.02 10*3/uL (ref 0.00–0.07)
Basophils Absolute: 0 10*3/uL (ref 0.0–0.1)
Basophils Relative: 1 %
Eosinophils Absolute: 0.1 10*3/uL (ref 0.0–0.5)
Eosinophils Relative: 1 %
HCT: 44.6 % (ref 39.0–52.0)
Hemoglobin: 14.7 g/dL (ref 13.0–17.0)
Immature Granulocytes: 0 %
Lymphocytes Relative: 34 %
Lymphs Abs: 2 10*3/uL (ref 0.7–4.0)
MCH: 30.8 pg (ref 26.0–34.0)
MCHC: 33 g/dL (ref 30.0–36.0)
MCV: 93.5 fL (ref 80.0–100.0)
Monocytes Absolute: 0.8 10*3/uL (ref 0.1–1.0)
Monocytes Relative: 13 %
Neutro Abs: 3.1 10*3/uL (ref 1.7–7.7)
Neutrophils Relative %: 51 %
Platelets: 247 10*3/uL (ref 150–400)
RBC: 4.77 MIL/uL (ref 4.22–5.81)
RDW: 13 % (ref 11.5–15.5)
WBC: 5.9 10*3/uL (ref 4.0–10.5)
nRBC: 0 % (ref 0.0–0.2)

## 2020-01-17 LAB — HEMOGLOBIN A1C
Hgb A1c MFr Bld: 6.3 % — ABNORMAL HIGH (ref 4.8–5.6)
Mean Plasma Glucose: 134.11 mg/dL

## 2020-01-17 LAB — SARS CORONAVIRUS 2 (TAT 6-24 HRS): SARS Coronavirus 2: NEGATIVE

## 2020-01-18 ENCOUNTER — Encounter (HOSPITAL_COMMUNITY): Payer: Self-pay | Admitting: General Surgery

## 2020-01-18 ENCOUNTER — Ambulatory Visit (HOSPITAL_COMMUNITY): Payer: Medicare HMO | Admitting: Anesthesiology

## 2020-01-18 ENCOUNTER — Encounter (HOSPITAL_COMMUNITY)
Admission: RE | Disposition: A | Discharge status: Home / Self Care | Payer: Self-pay | Source: Home / Self Care | Attending: General Surgery

## 2020-01-18 ENCOUNTER — Ambulatory Visit (HOSPITAL_COMMUNITY)
Admission: RE | Admit: 2020-01-18 | Discharge: 2020-01-18 | Disposition: A | Discharge status: Home / Self Care | Payer: Medicare HMO | Attending: General Surgery | Admitting: General Surgery

## 2020-01-18 DIAGNOSIS — M1990 Unspecified osteoarthritis, unspecified site: Secondary | ICD-10-CM | POA: Insufficient documentation

## 2020-01-18 DIAGNOSIS — F419 Anxiety disorder, unspecified: Secondary | ICD-10-CM | POA: Insufficient documentation

## 2020-01-18 DIAGNOSIS — K8010 Calculus of gallbladder with chronic cholecystitis without obstruction: Secondary | ICD-10-CM | POA: Insufficient documentation

## 2020-01-18 DIAGNOSIS — Z7982 Long term (current) use of aspirin: Secondary | ICD-10-CM | POA: Insufficient documentation

## 2020-01-18 DIAGNOSIS — Z79899 Other long term (current) drug therapy: Secondary | ICD-10-CM | POA: Diagnosis not present

## 2020-01-18 DIAGNOSIS — R69 Illness, unspecified: Secondary | ICD-10-CM | POA: Diagnosis not present

## 2020-01-18 DIAGNOSIS — K219 Gastro-esophageal reflux disease without esophagitis: Secondary | ICD-10-CM | POA: Insufficient documentation

## 2020-01-18 DIAGNOSIS — E119 Type 2 diabetes mellitus without complications: Secondary | ICD-10-CM | POA: Insufficient documentation

## 2020-01-18 DIAGNOSIS — E7800 Pure hypercholesterolemia, unspecified: Secondary | ICD-10-CM | POA: Insufficient documentation

## 2020-01-18 DIAGNOSIS — I10 Essential (primary) hypertension: Secondary | ICD-10-CM | POA: Diagnosis not present

## 2020-01-18 DIAGNOSIS — K8020 Calculus of gallbladder without cholecystitis without obstruction: Secondary | ICD-10-CM | POA: Diagnosis not present

## 2020-01-18 HISTORY — PX: CHOLECYSTECTOMY: SHX55

## 2020-01-18 LAB — GLUCOSE, CAPILLARY
Glucose-Capillary: 116 mg/dL — ABNORMAL HIGH (ref 70–99)
Glucose-Capillary: 158 mg/dL — ABNORMAL HIGH (ref 70–99)

## 2020-01-18 SURGERY — LAPAROSCOPIC CHOLECYSTECTOMY
Anesthesia: General | Site: Abdomen

## 2020-01-18 MED ORDER — LACTATED RINGERS IV SOLN: INTRAVENOUS | Status: DC | PRN

## 2020-01-18 MED ORDER — PROPOFOL 10 MG/ML IV BOLUS
INTRAVENOUS | Status: AC
  Filled 2020-01-18: qty 20

## 2020-01-18 MED ORDER — ONDANSETRON HCL 4 MG/2ML IJ SOLN
INTRAMUSCULAR | Status: DC | PRN
  Administered 2020-01-18: 4 mg via INTRAVENOUS

## 2020-01-18 MED ORDER — LIDOCAINE 2% (20 MG/ML) 5 ML SYRINGE
INTRAMUSCULAR | Status: DC | PRN
  Administered 2020-01-18: 100 mg via INTRAVENOUS

## 2020-01-18 MED ORDER — ROCURONIUM BROMIDE 10 MG/ML (PF) SYRINGE
PREFILLED_SYRINGE | INTRAVENOUS | Status: AC
  Filled 2020-01-18: qty 10

## 2020-01-18 MED ORDER — BUPIVACAINE LIPOSOME 1.3 % IJ SUSP
INTRAMUSCULAR | Status: DC | PRN
  Administered 2020-01-18: 20 mL

## 2020-01-18 MED ORDER — BUPIVACAINE LIPOSOME 1.3 % IJ SUSP
INTRAMUSCULAR | Status: AC
  Filled 2020-01-18: qty 20

## 2020-01-18 MED ORDER — HEMOSTATIC AGENTS (NO CHARGE) OPTIME
TOPICAL | Status: DC | PRN
  Administered 2020-01-18: 1 via TOPICAL

## 2020-01-18 MED ORDER — CHLORHEXIDINE GLUCONATE CLOTH 2 % EX PADS: 6.0000 | MEDICATED_PAD | Freq: Once | CUTANEOUS | Status: DC

## 2020-01-18 MED ORDER — PROPOFOL 10 MG/ML IV BOLUS
INTRAVENOUS | Status: DC | PRN
  Administered 2020-01-18: 150 mg via INTRAVENOUS

## 2020-01-18 MED ORDER — KETOROLAC TROMETHAMINE 30 MG/ML IJ SOLN
15.0000 mg | Freq: Once | INTRAMUSCULAR | Status: AC
  Administered 2020-01-18: 15 mg via INTRAVENOUS
  Filled 2020-01-18: qty 1

## 2020-01-18 MED ORDER — FENTANYL CITRATE (PF) 250 MCG/5ML IJ SOLN
INTRAMUSCULAR | Status: AC
  Filled 2020-01-18: qty 5

## 2020-01-18 MED ORDER — ONDANSETRON HCL 4 MG/2ML IJ SOLN
INTRAMUSCULAR | Status: AC
  Filled 2020-01-18: qty 2

## 2020-01-18 MED ORDER — ROCURONIUM BROMIDE 10 MG/ML (PF) SYRINGE
PREFILLED_SYRINGE | INTRAVENOUS | Status: DC | PRN
  Administered 2020-01-18: 50 mg via INTRAVENOUS

## 2020-01-18 MED ORDER — DEXAMETHASONE SODIUM PHOSPHATE 10 MG/ML IJ SOLN
INTRAMUSCULAR | Status: AC
  Filled 2020-01-18: qty 1

## 2020-01-18 MED ORDER — EPHEDRINE SULFATE 50 MG/ML IJ SOLN
INTRAMUSCULAR | Status: DC | PRN
  Administered 2020-01-18: 5 mg via INTRAVENOUS
  Administered 2020-01-18 (×2): 10 mg via INTRAVENOUS

## 2020-01-18 MED ORDER — SODIUM CHLORIDE 0.9 % IR SOLN
Status: DC | PRN
  Administered 2020-01-18: 1000 mL

## 2020-01-18 MED ORDER — CIPROFLOXACIN IN D5W 400 MG/200ML IV SOLN
400.0000 mg | INTRAVENOUS | Status: AC
  Administered 2020-01-18: 400 mg via INTRAVENOUS

## 2020-01-18 MED ORDER — ORAL CARE MOUTH RINSE: 15.0000 mL | Freq: Once | OROMUCOSAL | Status: AC

## 2020-01-18 MED ORDER — DEXAMETHASONE SODIUM PHOSPHATE 10 MG/ML IJ SOLN
INTRAMUSCULAR | Status: DC | PRN
  Administered 2020-01-18: 10 mg via INTRAVENOUS

## 2020-01-18 MED ORDER — LIDOCAINE 2% (20 MG/ML) 5 ML SYRINGE
INTRAMUSCULAR | Status: AC
  Filled 2020-01-18: qty 5

## 2020-01-18 MED ORDER — ONDANSETRON HCL 4 MG/2ML IJ SOLN
4.0000 mg | Freq: Once | INTRAMUSCULAR | Status: AC | PRN
  Administered 2020-01-18: 4 mg via INTRAVENOUS
  Filled 2020-01-18: qty 2

## 2020-01-18 MED ORDER — CHLORHEXIDINE GLUCONATE 0.12 % MT SOLN
15.0000 mL | Freq: Once | OROMUCOSAL | Status: AC
  Administered 2020-01-18: 15 mL via OROMUCOSAL

## 2020-01-18 MED ORDER — TRAMADOL HCL 50 MG PO TABS: 50.0000 mg | ORAL_TABLET | Freq: Four times a day (QID) | ORAL | 0 refills | Status: AC | PRN

## 2020-01-18 MED ORDER — FENTANYL CITRATE (PF) 250 MCG/5ML IJ SOLN
INTRAMUSCULAR | Status: DC | PRN
  Administered 2020-01-18: 100 ug via INTRAVENOUS
  Administered 2020-01-18: 50 ug via INTRAVENOUS

## 2020-01-18 MED ORDER — MEPERIDINE HCL 50 MG/ML IJ SOLN: 6.2500 mg | INTRAMUSCULAR | Status: DC | PRN

## 2020-01-18 MED ORDER — HYDROMORPHONE HCL 1 MG/ML IJ SOLN
0.2500 mg | INTRAMUSCULAR | Status: DC | PRN
  Administered 2020-01-18 (×2): 0.5 mg via INTRAVENOUS
  Filled 2020-01-18 (×2): qty 0.5

## 2020-01-18 MED ORDER — CIPROFLOXACIN IN D5W 400 MG/200ML IV SOLN
INTRAVENOUS | Status: AC
  Filled 2020-01-18: qty 200

## 2020-01-18 MED ORDER — SUGAMMADEX SODIUM 200 MG/2ML IV SOLN
INTRAVENOUS | Status: DC | PRN
  Administered 2020-01-18: 200 mg via INTRAVENOUS

## 2020-01-18 MED ORDER — LACTATED RINGERS IV SOLN
Freq: Once | INTRAVENOUS | Status: AC
  Administered 2020-01-18: 1000 mL via INTRAVENOUS

## 2020-01-18 SURGICAL SUPPLY — 45 items
ADH SKN CLS APL DERMABOND .7 (GAUZE/BANDAGES/DRESSINGS) ×1
APL SRG 38 LTWT LNG FL B (MISCELLANEOUS)
APPLICATOR ARISTA FLEXITIP XL (MISCELLANEOUS) IMPLANT
APPLIER CLIP ROT 10 11.4 M/L (STAPLE) ×2
APR CLP MED LRG 11.4X10 (STAPLE) ×1
BAG RETRIEVAL 10 (BASKET) ×1
CLIP APPLIE ROT 10 11.4 M/L (STAPLE) ×1 IMPLANT
CLOTH BEACON ORANGE TIMEOUT ST (SAFETY) ×2 IMPLANT
COVER LIGHT HANDLE STERIS (MISCELLANEOUS) ×4 IMPLANT
COVER WAND RF STERILE (DRAPES) ×2 IMPLANT
DERMABOND ADVANCED (GAUZE/BANDAGES/DRESSINGS) ×1
DERMABOND ADVANCED .7 DNX12 (GAUZE/BANDAGES/DRESSINGS) ×1 IMPLANT
DURAPREP 26ML APPLICATOR (WOUND CARE) ×2 IMPLANT
ELECT REM PT RETURN 9FT ADLT (ELECTROSURGICAL) ×2
ELECTRODE REM PT RTRN 9FT ADLT (ELECTROSURGICAL) ×1 IMPLANT
GLOVE BIOGEL PI IND STRL 7.0 (GLOVE) ×2 IMPLANT
GLOVE BIOGEL PI INDICATOR 7.0 (GLOVE) ×2
GLOVE SURG SS PI 7.5 STRL IVOR (GLOVE) ×3 IMPLANT
GOWN STRL REUS W/TWL LRG LVL3 (GOWN DISPOSABLE) ×6 IMPLANT
HEMOSTAT ARISTA ABSORB 3G PWDR (HEMOSTASIS) IMPLANT
HEMOSTAT SNOW SURGICEL 2X4 (HEMOSTASIS) ×2 IMPLANT
INST SET LAPROSCOPIC AP (KITS) ×2 IMPLANT
KIT TURNOVER KIT A (KITS) ×2 IMPLANT
MANIFOLD NEPTUNE II (INSTRUMENTS) ×2 IMPLANT
NDL HYPO 18GX1.5 BLUNT FILL (NEEDLE) ×1 IMPLANT
NDL INSUFFLATION 14GA 120MM (NEEDLE) ×1 IMPLANT
NEEDLE HYPO 18GX1.5 BLUNT FILL (NEEDLE) ×2 IMPLANT
NEEDLE HYPO 22GX1.5 SAFETY (NEEDLE) ×2 IMPLANT
NEEDLE INSUFFLATION 14GA 120MM (NEEDLE) ×2 IMPLANT
NS IRRIG 1000ML POUR BTL (IV SOLUTION) ×2 IMPLANT
PACK LAP CHOLE LZT030E (CUSTOM PROCEDURE TRAY) ×2 IMPLANT
PAD ARMBOARD 7.5X6 YLW CONV (MISCELLANEOUS) ×2 IMPLANT
SET BASIN LINEN APH (SET/KITS/TRAYS/PACK) ×2 IMPLANT
SET TUBE SMOKE EVAC HIGH FLOW (TUBING) ×2 IMPLANT
SLEEVE ENDOPATH XCEL 5M (ENDOMECHANICALS) ×2 IMPLANT
SUT MNCRL AB 4-0 PS2 18 (SUTURE) ×4 IMPLANT
SUT VICRYL 0 UR6 27IN ABS (SUTURE) ×2 IMPLANT
SYR 20ML LL LF (SYRINGE) ×4 IMPLANT
SYS BAG RETRIEVAL 10MM (BASKET) ×1
SYSTEM BAG RETRIEVAL 10MM (BASKET) ×1 IMPLANT
TROCAR ENDO BLADELESS 11MM (ENDOMECHANICALS) ×2 IMPLANT
TROCAR XCEL NON-BLD 5MMX100MML (ENDOMECHANICALS) ×2 IMPLANT
TROCAR XCEL UNIV SLVE 11M 100M (ENDOMECHANICALS) ×2 IMPLANT
TUBE CONNECTING 12X1/4 (SUCTIONS) ×2 IMPLANT
WARMER LAPAROSCOPE (MISCELLANEOUS) ×2 IMPLANT

## 2020-01-18 NOTE — Transfer of Care (Signed)
Immediate Anesthesia Transfer of Care Note  Patient: Jacob Nash  Procedure(s) Performed: LAPAROSCOPIC CHOLECYSTECTOMY (N/A Abdomen)  Patient Location: PACU  Anesthesia Type:General  Level of Consciousness: awake, alert , oriented and patient cooperative  Airway & Oxygen Therapy: Patient Spontanous Breathing and Patient connected to face mask oxygen  Post-op Assessment: Report given to RN and Post -op Vital signs reviewed and stable  Post vital signs: Reviewed and stable  Last Vitals:  Vitals Value Taken Time  BP 137/80 01/18/20 0946  Temp    Pulse 72 01/18/20 0947  Resp 16 01/18/20 0947  SpO2 98 % 01/18/20 0947  Vitals shown include unvalidated device data.  Last Pain:  Vitals:   01/18/20 0725  TempSrc: Oral  PainSc: 0-No pain      Patients Stated Pain Goal: 8 (45/40/98 1191)  Complications: No complications documented.

## 2020-01-18 NOTE — Op Note (Signed)
Patient:  Jacob Nash  DOB:  1946-01-21  MRN:  [id number]   Preop Diagnosis: Biliary colic, cholelithiasis  Postop Diagnosis: Same  Procedure: Laparoscopic cholecystectomy  Surgeon: Aviva Signs, MD  Anes: General endotracheal  Indications: Patient is a 74 year old white male who presents with biliary colic secondary to cholelithiasis. The risks and benefits of the procedure including bleeding, infection, hepatobiliary injury, and the possibility of an open procedure were fully explained to the patient, who gave informed consent.  Procedure note: The patient was placed in the supine position. After induction of general endotracheal anesthesia, the abdomen was prepped and draped using the usual sterile technique with ChloraPrep. Surgical site confirmation was performed.  An infraumbilical incision was made down to the fascia. A Veress needle was introduced into the abdominal cavity and confirmation of placement was done using the saline drop test. The abdomen was then insufflated to 15 mmHg pressure. An 11 mm trocar was introduced into the abdominal cavity under direct visualization without difficulty. The patient was placed in reverse Trendelenburg position and an additional 11 mm trocar was placed in the epigastric region and 5 mm trochars were placed in the right upper quadrant and right flank regions. Liver was inspected noted to be within normal limits. The gallbladder was retracted in a dynamic fashion in order to provide a critical view of the triangle of [place]. The cystic duct was first identified. Its juncture to the infundibulum was fully identified. Endoclips were placed proximally and distally on the cystic duct, and the cystic duct was divided. This was likewise done the cystic artery. The gallbladder was freed away from the gallbladder fossa using Bovie electrocautery. The gallbladder was delivered through the epigastric trocar site using Endo Catch bag. The gallbladder fossa was  inspected and no abnormal bleeding or bile leakage was noted. Surgicel snow was placed in the gallbladder fossa. All fluid and air were then evacuated from the abdominal cavity prior to the removal of the trochars.  All wounds were irrigated with normal saline. All wounds were injected with Exparel. The skin wound was closed using 4-0 Monocryl subcuticular sutures. Dermabond was applied.  All tape and needle counts were correct at the end of the procedure. The patient was extubated in the operating room and transferred to PACU in stable condition.  Complications: None  EBL: Minimal  Specimen: Gallbladder

## 2020-01-18 NOTE — Discharge Instructions (Signed)
[facility] THE [place] [facility] UNTIL Sunday January 22, 2020. DO NOT USE ANY FURTHER NUMBING MEDICATIONS UNTIL AFTER Sunday    Laparoscopic Cholecystectomy, Care After This sheet gives you information about how to care for yourself after your procedure. Your health care provider may also give you more specific instructions. If you have problems or questions, contact your health care provider. What can I expect after the procedure? After the procedure, it is common to have:  Pain at your incision sites. You will be given medicines to control this pain.  Mild nausea or vomiting.  Bloating and possible shoulder pain from the air-like gas that was used during the procedure. Follow these instructions at home: Incision care   Follow instructions from your health care provider about how to take care of your incisions. Make sure you: ? Wash your hands with soap and water before you change your bandage (dressing). If soap and water are not available, use hand sanitizer. ? Change your dressing as told by your health care provider. ? Leave stitches (sutures), skin glue, or adhesive strips in place. These skin closures may need to be in place for 2 weeks or longer. If adhesive strip edges start to loosen and curl up, you may trim the loose edges. Do not remove adhesive strips completely unless your health care provider tells you to do that.  Do not take baths, swim, or use a hot tub until your health care provider approves. Ask your health care provider if you can take showers. You may only be allowed to take sponge baths for bathing.  Check your incision area every day for signs of infection. Check for: ? More redness, swelling, or pain. ? More fluid or blood. ? Warmth. ? Pus or a bad smell. Activity  Do not drive or use heavy machinery while taking prescription pain medicine.  Do not lift anything that is heavier than 10 lb (4.5 kg) until your health care provider approves.  Do not  play contact sports until your health care provider approves.  Do not drive for 24 hours if you were given a medicine to help you relax (sedative).  Rest as needed. Do not return to work or school until your health care provider approves. General instructions  Take over-the-counter and prescription medicines only as told by your health care provider.  To prevent or treat constipation while you are taking prescription pain medicine, your health care provider may recommend that you: ? Drink enough fluid to keep your urine clear or pale yellow. ? Take over-the-counter or prescription medicines. ? Eat foods that are high in fiber, such as fresh fruits and vegetables, whole grains, and beans. ? Limit foods that are high in fat and processed sugars, such as fried and sweet foods. Contact a health care provider if:  You develop a rash.  You have more redness, swelling, or pain around your incisions.  You have more fluid or blood coming from your incisions.  Your incisions feel warm to the touch.  You have pus or a bad smell coming from your incisions.  You have a fever.  One or more of your incisions breaks open. Get help right away if:  You have trouble breathing.  You have chest pain.  You have increasing pain in your shoulders.  You faint or feel dizzy when you stand.  You have severe pain in your abdomen.  You have nausea or vomiting that lasts for more than one day.  You have leg pain.  This information is not intended to replace advice given to you by your health care provider. Make sure you discuss any questions you have with your health care provider. Document Revised: 04/17/2017 Document Reviewed: 10/22/2015 Elsevier Patient Education  2020 [place] Anesthesia, Adult, Care After This sheet gives you information about how to care for yourself after your procedure. Your health care provider may also give you more specific instructions. If you have  problems or questions, contact your health care provider. What can I expect after the procedure? After the procedure, the following side effects are common:  Pain or discomfort at the IV site.  Nausea.  Vomiting.  Sore throat.  Trouble concentrating.  Feeling cold or chills.  Weak or tired.  Sleepiness and fatigue.  Soreness and body aches. These side effects can affect parts of the body that were not involved in surgery. Follow these instructions at home:  For at least 24 hours after the procedure:  Have a responsible adult stay with you. It is important to have someone help care for you until you are awake and alert.  Rest as needed.  Do not: ? Participate in activities in which you could fall or become injured. ? Drive. ? Use heavy machinery. ? Drink alcohol. ? Take sleeping pills or medicines that cause drowsiness. ? Make important decisions or sign legal documents. ? Take care of children on your own. Eating and drinking  Follow any instructions from your health care provider about eating or drinking restrictions.  When you feel hungry, start by eating small amounts of foods that are soft and easy to digest (bland), such as toast. Gradually return to your regular diet.  Drink enough fluid to keep your urine pale yellow.  If you vomit, rehydrate by drinking water, juice, or clear broth. General instructions  If you have sleep apnea, surgery and certain medicines can increase your risk for breathing problems. Follow instructions from your health care provider about wearing your sleep device: ? Anytime you are sleeping, including during daytime naps. ? While taking prescription pain medicines, sleeping medicines, or medicines that make you drowsy.  Return to your normal activities as told by your health care provider. Ask your health care provider what activities are safe for you.  Take over-the-counter and prescription medicines only as told by your health  care provider.  If you smoke, do not smoke without supervision.  Keep all follow-up visits as told by your health care provider. This is important. Contact a health care provider if:  You have nausea or vomiting that does not get better with medicine.  You cannot eat or drink without vomiting.  You have pain that does not get better with medicine.  You are unable to pass urine.  You develop a skin rash.  You have a fever.  You have redness around your IV site that gets worse. Get help right away if:  You have difficulty breathing.  You have chest pain.  You have blood in your urine or stool, or you vomit blood. Summary  After the procedure, it is common to have a sore throat or nausea. It is also common to feel tired.  Have a responsible adult stay with you for the first 24 hours after general anesthesia. It is important to have someone help care for you until you are awake and alert.  When you feel hungry, start by eating small amounts of foods that are soft and easy to digest (bland),  such as toast. Gradually return to your regular diet.  Drink enough fluid to keep your urine pale yellow.  Return to your normal activities as told by your health care provider. Ask your health care provider what activities are safe for you. This information is not intended to replace advice given to you by your health care provider. Make sure you discuss any questions you have with your health care provider. Document Revised: 05/08/2017 Document Reviewed: 12/19/2016 Elsevier Patient Education  [place].

## 2020-01-18 NOTE — Anesthesia Preprocedure Evaluation (Addendum)
Anesthesia Evaluation  Patient identified by MRN, date of birth, ID band Patient awake    Reviewed: Allergy & Precautions, NPO status , Patient's Chart, lab work & pertinent test results  History of Anesthesia Complications (+) PONV and history of anesthetic complications  Airway Mallampati: II  TM Distance: >3 FB Neck ROM: Full    Dental  (+) Dental Advisory Given, Teeth Intact   Pulmonary neg pulmonary ROS,    Pulmonary exam normal breath sounds clear to auscultation       Cardiovascular Exercise Tolerance: Good hypertension, Pt. on medications Normal cardiovascular exam Rhythm:Regular Rate:Normal  17-Jan-2020 08:09:09 [facility]-OPS ROUTINE RECORD Sinus rhythm with 1st degree A-V block Minimal voltage criteria for LVH, may be normal variant ( R in aVL ) Borderline ECG Confirmed by Asencion Noble [phone]) on 01/17/2020 7:54:32 PM   Neuro/Psych Anxiety negative neurological ROS  negative psych ROS   GI/Hepatic GERD  Medicated and Controlled,  Endo/Other  diabetes, Well Controlled, Type 2, Oral Hypoglycemic Agents  Renal/GU Renal disease   Prostate cancer    Musculoskeletal  (+) Arthritis , Osteoarthritis,    Abdominal   Peds  Hematology   Anesthesia Other Findings   Reproductive/Obstetrics                          Anesthesia Physical Anesthesia Plan  ASA: II  Anesthesia Plan: General   Post-op Pain Management:    Induction: Intravenous  PONV Risk Score and Plan: 4 or greater and Ondansetron and Dexamethasone  Airway Management Planned: Oral ETT  Additional Equipment:   Intra-op Plan:   Post-operative Plan: Extubation in OR  Informed Consent: I have reviewed the patients History and Physical, chart, labs and discussed the procedure including the risks, benefits and alternatives for the proposed anesthesia with the patient or authorized representative who has  indicated his/her understanding and acceptance.     Dental advisory given  Plan Discussed with: CRNA and Surgeon  Anesthesia Plan Comments:        Anesthesia Quick Evaluation

## 2020-01-18 NOTE — Anesthesia Postprocedure Evaluation (Signed)
Anesthesia Post Note  Patient: Jacob Nash  Procedure(s) Performed: LAPAROSCOPIC CHOLECYSTECTOMY (N/A Abdomen)  Patient location during evaluation: PACU Anesthesia Type: General Level of consciousness: awake and alert, oriented and patient cooperative Pain management: pain level controlled Vital Signs Assessment: post-procedure vital signs reviewed and stable Respiratory status: spontaneous breathing, respiratory function stable and patient connected to face mask oxygen Cardiovascular status: blood pressure returned to baseline and stable Postop Assessment: no apparent nausea or vomiting, no headache and no backache Anesthetic complications: no   No complications documented.   Last Vitals:  Vitals:   01/18/20 0725  BP: (!) 140/92  Resp: (!) 26  Temp: 36.9 C  SpO2: 95%    Last Pain:  Vitals:   01/18/20 0725  TempSrc: Oral  PainSc: 0-No pain                 Tessla Spurling

## 2020-01-18 NOTE — Interval H&P Note (Signed)
History and Physical Interval Note:  01/18/2020 8:12 AM  Jacob Nash  has presented today for surgery, with the diagnosis of Cholelithiasis.  The various methods of treatment have been discussed with the patient and family. After consideration of risks, benefits and other options for treatment, the patient has consented to  Procedure(s): LAPAROSCOPIC CHOLECYSTECTOMY (N/A) as a surgical intervention.  The patient's history has been reviewed, patient examined, no change in status, stable for surgery.  I have reviewed the patient's chart and labs.  Questions were answered to the patient's satisfaction.     Aviva Signs

## 2020-01-19 ENCOUNTER — Encounter (HOSPITAL_COMMUNITY): Payer: Self-pay | Admitting: General Surgery

## 2020-01-19 LAB — SURGICAL PATHOLOGY

## 2020-01-20 DIAGNOSIS — R69 Illness, unspecified: Secondary | ICD-10-CM | POA: Diagnosis not present

## 2020-01-24 ENCOUNTER — Ambulatory Visit (INDEPENDENT_AMBULATORY_CARE_PROVIDER_SITE_OTHER): Payer: Medicare HMO | Admitting: General Surgery

## 2020-01-24 ENCOUNTER — Encounter: Payer: Self-pay | Admitting: General Surgery

## 2020-01-24 ENCOUNTER — Other Ambulatory Visit: Payer: Self-pay

## 2020-01-24 VITALS — BP 135/95 | HR 101 | Temp 98.6°F | Resp 12 | Ht 70.5 in | Wt 209.0 lb

## 2020-01-24 DIAGNOSIS — Z09 Encounter for follow-up examination after completed treatment for conditions other than malignant neoplasm: Secondary | ICD-10-CM

## 2020-01-24 NOTE — Progress Notes (Signed)
Subjective:     Jacob Nash  Here for postoperative check.  Doing well.  Preoperative symptoms resolved. Objective:    BP (!) 135/95   Pulse (!) 101   Temp 98.6 F (37 C) (Oral)   Resp 12   Ht 5' 10.5" (1.791 m)   Wt 209 lb (94.8 kg)   SpO2 95%   BMI 29.56 kg/m   General:  alert, cooperative and no distress  Abdomen soft, incisions healing well. Final pathology consistent with diagnosis.     Assessment:    Doing well postoperatively.    Plan:   May increase activity as able.  Follow-up here as needed.

## 2020-01-25 DIAGNOSIS — M5416 Radiculopathy, lumbar region: Secondary | ICD-10-CM | POA: Diagnosis not present

## 2020-02-06 DIAGNOSIS — M5416 Radiculopathy, lumbar region: Secondary | ICD-10-CM | POA: Diagnosis not present

## 2020-02-14 DIAGNOSIS — L03116 Cellulitis of left lower limb: Secondary | ICD-10-CM | POA: Diagnosis not present

## 2020-02-14 DIAGNOSIS — Z6828 Body mass index (BMI) 28.0-28.9, adult: Secondary | ICD-10-CM | POA: Diagnosis not present

## 2020-02-14 DIAGNOSIS — Z23 Encounter for immunization: Secondary | ICD-10-CM | POA: Diagnosis not present

## 2020-02-16 DIAGNOSIS — K2100 Gastro-esophageal reflux disease with esophagitis, without bleeding: Secondary | ICD-10-CM | POA: Diagnosis not present

## 2020-02-16 DIAGNOSIS — E7849 Other hyperlipidemia: Secondary | ICD-10-CM | POA: Diagnosis not present

## 2020-02-16 DIAGNOSIS — I10 Essential (primary) hypertension: Secondary | ICD-10-CM | POA: Diagnosis not present

## 2020-02-16 DIAGNOSIS — E1121 Type 2 diabetes mellitus with diabetic nephropathy: Secondary | ICD-10-CM | POA: Diagnosis not present

## 2020-02-17 DIAGNOSIS — N5201 Erectile dysfunction due to arterial insufficiency: Secondary | ICD-10-CM | POA: Diagnosis not present

## 2020-02-17 DIAGNOSIS — C61 Malignant neoplasm of prostate: Secondary | ICD-10-CM | POA: Diagnosis not present

## 2020-02-22 DIAGNOSIS — L03116 Cellulitis of left lower limb: Secondary | ICD-10-CM | POA: Diagnosis not present

## 2020-02-22 DIAGNOSIS — Z6828 Body mass index (BMI) 28.0-28.9, adult: Secondary | ICD-10-CM | POA: Diagnosis not present

## 2020-03-07 DIAGNOSIS — M5416 Radiculopathy, lumbar region: Secondary | ICD-10-CM | POA: Diagnosis not present

## 2020-03-13 DIAGNOSIS — Z23 Encounter for immunization: Secondary | ICD-10-CM | POA: Diagnosis not present

## 2020-03-15 DIAGNOSIS — R69 Illness, unspecified: Secondary | ICD-10-CM | POA: Diagnosis not present

## 2020-03-17 DIAGNOSIS — K2100 Gastro-esophageal reflux disease with esophagitis, without bleeding: Secondary | ICD-10-CM | POA: Diagnosis not present

## 2020-03-17 DIAGNOSIS — E7849 Other hyperlipidemia: Secondary | ICD-10-CM | POA: Diagnosis not present

## 2020-03-17 DIAGNOSIS — E1121 Type 2 diabetes mellitus with diabetic nephropathy: Secondary | ICD-10-CM | POA: Diagnosis not present

## 2020-03-17 DIAGNOSIS — I10 Essential (primary) hypertension: Secondary | ICD-10-CM | POA: Diagnosis not present

## 2020-03-29 DIAGNOSIS — M5416 Radiculopathy, lumbar region: Secondary | ICD-10-CM | POA: Diagnosis not present

## 2020-03-29 DIAGNOSIS — M940 Chondrocostal junction syndrome [Tietze]: Secondary | ICD-10-CM | POA: Diagnosis not present

## 2020-03-29 DIAGNOSIS — Z6829 Body mass index (BMI) 29.0-29.9, adult: Secondary | ICD-10-CM | POA: Diagnosis not present

## 2020-04-17 DIAGNOSIS — E7849 Other hyperlipidemia: Secondary | ICD-10-CM | POA: Diagnosis not present

## 2020-04-17 DIAGNOSIS — E1121 Type 2 diabetes mellitus with diabetic nephropathy: Secondary | ICD-10-CM | POA: Diagnosis not present

## 2020-04-17 DIAGNOSIS — I10 Essential (primary) hypertension: Secondary | ICD-10-CM | POA: Diagnosis not present

## 2020-04-17 DIAGNOSIS — R69 Illness, unspecified: Secondary | ICD-10-CM | POA: Diagnosis not present

## 2020-04-25 DIAGNOSIS — Z1331 Encounter for screening for depression: Secondary | ICD-10-CM | POA: Diagnosis not present

## 2020-04-25 DIAGNOSIS — S39012A Strain of muscle, fascia and tendon of lower back, initial encounter: Secondary | ICD-10-CM | POA: Diagnosis not present

## 2020-04-25 DIAGNOSIS — Z6829 Body mass index (BMI) 29.0-29.9, adult: Secondary | ICD-10-CM | POA: Diagnosis not present

## 2020-04-25 DIAGNOSIS — Z1389 Encounter for screening for other disorder: Secondary | ICD-10-CM | POA: Diagnosis not present

## 2020-04-25 DIAGNOSIS — R739 Hyperglycemia, unspecified: Secondary | ICD-10-CM | POA: Diagnosis not present

## 2020-04-27 DIAGNOSIS — M5416 Radiculopathy, lumbar region: Secondary | ICD-10-CM | POA: Diagnosis not present

## 2020-05-10 DIAGNOSIS — R69 Illness, unspecified: Secondary | ICD-10-CM | POA: Diagnosis not present

## 2020-05-30 DIAGNOSIS — I10 Essential (primary) hypertension: Secondary | ICD-10-CM | POA: Diagnosis not present

## 2020-05-30 DIAGNOSIS — E7800 Pure hypercholesterolemia, unspecified: Secondary | ICD-10-CM | POA: Diagnosis not present

## 2020-05-30 DIAGNOSIS — K2100 Gastro-esophageal reflux disease with esophagitis, without bleeding: Secondary | ICD-10-CM | POA: Diagnosis not present

## 2020-05-30 DIAGNOSIS — E1121 Type 2 diabetes mellitus with diabetic nephropathy: Secondary | ICD-10-CM | POA: Diagnosis not present

## 2020-05-30 DIAGNOSIS — E782 Mixed hyperlipidemia: Secondary | ICD-10-CM | POA: Diagnosis not present

## 2020-06-11 DIAGNOSIS — M47816 Spondylosis without myelopathy or radiculopathy, lumbar region: Secondary | ICD-10-CM | POA: Diagnosis not present

## 2020-06-18 DIAGNOSIS — E119 Type 2 diabetes mellitus without complications: Secondary | ICD-10-CM | POA: Diagnosis not present

## 2020-06-18 DIAGNOSIS — E041 Nontoxic single thyroid nodule: Secondary | ICD-10-CM | POA: Diagnosis not present

## 2020-06-18 DIAGNOSIS — E7849 Other hyperlipidemia: Secondary | ICD-10-CM | POA: Diagnosis not present

## 2020-06-18 DIAGNOSIS — Z0001 Encounter for general adult medical examination with abnormal findings: Secondary | ICD-10-CM | POA: Diagnosis not present

## 2020-06-18 DIAGNOSIS — I10 Essential (primary) hypertension: Secondary | ICD-10-CM | POA: Diagnosis not present

## 2020-06-18 DIAGNOSIS — K2100 Gastro-esophageal reflux disease with esophagitis, without bleeding: Secondary | ICD-10-CM | POA: Diagnosis not present

## 2020-06-18 DIAGNOSIS — Z6829 Body mass index (BMI) 29.0-29.9, adult: Secondary | ICD-10-CM | POA: Diagnosis not present

## 2020-06-18 DIAGNOSIS — R69 Illness, unspecified: Secondary | ICD-10-CM | POA: Diagnosis not present

## 2020-06-21 DIAGNOSIS — M47816 Spondylosis without myelopathy or radiculopathy, lumbar region: Secondary | ICD-10-CM | POA: Diagnosis not present

## 2020-06-25 DIAGNOSIS — E041 Nontoxic single thyroid nodule: Secondary | ICD-10-CM | POA: Diagnosis not present

## 2020-06-25 DIAGNOSIS — E042 Nontoxic multinodular goiter: Secondary | ICD-10-CM | POA: Diagnosis not present

## 2020-07-09 DIAGNOSIS — M47816 Spondylosis without myelopathy or radiculopathy, lumbar region: Secondary | ICD-10-CM | POA: Diagnosis not present

## 2020-07-16 DIAGNOSIS — E7849 Other hyperlipidemia: Secondary | ICD-10-CM | POA: Diagnosis not present

## 2020-07-16 DIAGNOSIS — I10 Essential (primary) hypertension: Secondary | ICD-10-CM | POA: Diagnosis not present

## 2020-07-16 DIAGNOSIS — K2100 Gastro-esophageal reflux disease with esophagitis, without bleeding: Secondary | ICD-10-CM | POA: Diagnosis not present

## 2020-07-16 DIAGNOSIS — E1121 Type 2 diabetes mellitus with diabetic nephropathy: Secondary | ICD-10-CM | POA: Diagnosis not present

## 2020-08-03 DIAGNOSIS — R69 Illness, unspecified: Secondary | ICD-10-CM | POA: Diagnosis not present

## 2020-08-03 DIAGNOSIS — I10 Essential (primary) hypertension: Secondary | ICD-10-CM | POA: Diagnosis not present

## 2020-08-03 DIAGNOSIS — K219 Gastro-esophageal reflux disease without esophagitis: Secondary | ICD-10-CM | POA: Diagnosis not present

## 2020-08-03 DIAGNOSIS — I951 Orthostatic hypotension: Secondary | ICD-10-CM | POA: Diagnosis not present

## 2020-08-03 DIAGNOSIS — E669 Obesity, unspecified: Secondary | ICD-10-CM | POA: Diagnosis not present

## 2020-08-03 DIAGNOSIS — G8929 Other chronic pain: Secondary | ICD-10-CM | POA: Diagnosis not present

## 2020-08-03 DIAGNOSIS — J309 Allergic rhinitis, unspecified: Secondary | ICD-10-CM | POA: Diagnosis not present

## 2020-08-03 DIAGNOSIS — E119 Type 2 diabetes mellitus without complications: Secondary | ICD-10-CM | POA: Diagnosis not present

## 2020-08-03 DIAGNOSIS — E785 Hyperlipidemia, unspecified: Secondary | ICD-10-CM | POA: Diagnosis not present

## 2020-08-03 DIAGNOSIS — M1990 Unspecified osteoarthritis, unspecified site: Secondary | ICD-10-CM | POA: Diagnosis not present

## 2020-08-15 DIAGNOSIS — E1121 Type 2 diabetes mellitus with diabetic nephropathy: Secondary | ICD-10-CM | POA: Diagnosis not present

## 2020-08-15 DIAGNOSIS — E7849 Other hyperlipidemia: Secondary | ICD-10-CM | POA: Diagnosis not present

## 2020-08-15 DIAGNOSIS — K2100 Gastro-esophageal reflux disease with esophagitis, without bleeding: Secondary | ICD-10-CM | POA: Diagnosis not present

## 2020-08-15 DIAGNOSIS — I10 Essential (primary) hypertension: Secondary | ICD-10-CM | POA: Diagnosis not present

## 2020-08-29 DIAGNOSIS — C61 Malignant neoplasm of prostate: Secondary | ICD-10-CM | POA: Diagnosis not present

## 2020-09-25 DIAGNOSIS — Z6828 Body mass index (BMI) 28.0-28.9, adult: Secondary | ICD-10-CM | POA: Diagnosis not present

## 2020-09-25 DIAGNOSIS — R300 Dysuria: Secondary | ICD-10-CM | POA: Diagnosis not present

## 2020-09-25 DIAGNOSIS — Z23 Encounter for immunization: Secondary | ICD-10-CM | POA: Diagnosis not present

## 2020-09-25 DIAGNOSIS — M545 Low back pain, unspecified: Secondary | ICD-10-CM | POA: Diagnosis not present

## 2020-09-28 DIAGNOSIS — M549 Dorsalgia, unspecified: Secondary | ICD-10-CM | POA: Diagnosis not present

## 2020-09-28 DIAGNOSIS — Z9049 Acquired absence of other specified parts of digestive tract: Secondary | ICD-10-CM | POA: Diagnosis not present

## 2020-09-28 DIAGNOSIS — R109 Unspecified abdominal pain: Secondary | ICD-10-CM | POA: Diagnosis not present

## 2020-09-28 DIAGNOSIS — N281 Cyst of kidney, acquired: Secondary | ICD-10-CM | POA: Diagnosis not present

## 2020-10-05 DIAGNOSIS — D49511 Neoplasm of unspecified behavior of right kidney: Secondary | ICD-10-CM | POA: Diagnosis not present

## 2020-10-09 ENCOUNTER — Other Ambulatory Visit: Payer: Self-pay | Admitting: Urology

## 2020-10-09 DIAGNOSIS — D49511 Neoplasm of unspecified behavior of right kidney: Secondary | ICD-10-CM

## 2020-10-12 DIAGNOSIS — Z7984 Long term (current) use of oral hypoglycemic drugs: Secondary | ICD-10-CM | POA: Diagnosis not present

## 2020-10-12 DIAGNOSIS — H524 Presbyopia: Secondary | ICD-10-CM | POA: Diagnosis not present

## 2020-10-12 DIAGNOSIS — H5203 Hypermetropia, bilateral: Secondary | ICD-10-CM | POA: Diagnosis not present

## 2020-10-12 DIAGNOSIS — E119 Type 2 diabetes mellitus without complications: Secondary | ICD-10-CM | POA: Diagnosis not present

## 2020-10-12 DIAGNOSIS — H2513 Age-related nuclear cataract, bilateral: Secondary | ICD-10-CM | POA: Diagnosis not present

## 2020-10-12 DIAGNOSIS — H52223 Regular astigmatism, bilateral: Secondary | ICD-10-CM | POA: Diagnosis not present

## 2020-10-25 ENCOUNTER — Ambulatory Visit
Admission: RE | Admit: 2020-10-25 | Discharge: 2020-10-25 | Disposition: A | Discharge status: Home / Self Care | Payer: Medicare HMO | Source: Ambulatory Visit | Attending: Urology | Admitting: Urology

## 2020-10-25 ENCOUNTER — Other Ambulatory Visit: Payer: Self-pay

## 2020-10-25 DIAGNOSIS — D49511 Neoplasm of unspecified behavior of right kidney: Secondary | ICD-10-CM

## 2020-10-25 DIAGNOSIS — N2889 Other specified disorders of kidney and ureter: Secondary | ICD-10-CM | POA: Diagnosis not present

## 2020-10-25 DIAGNOSIS — K760 Fatty (change of) liver, not elsewhere classified: Secondary | ICD-10-CM | POA: Diagnosis not present

## 2020-10-25 IMAGING — MR MR ABDOMEN WO/W CM
18 series · 48 of 48 positions shown · IV contrast (20 mL Multihance)
Comparison: Ultrasound dated [DATE]

CLINICAL DATA: Evaluate complex right the lesion identified on
ultrasound.

EXAM:
MRI ABDOMEN WITHOUT AND WITH CONTRAST
TECHNIQUE: Multiplanar multisequence MR imaging of the abdomen was performed
both before and after the administration of intravenous contrast.
CONTRAST:  20mL MULTIHANCE GADOBENATE DIMEGLUMINE 529 MG/ML IV SOLN

[Series 3: T2 · coronal · 5.0mm · 1.56mm/px · 1 of 36 slices shown (1 of 3)]
[im 1/36]
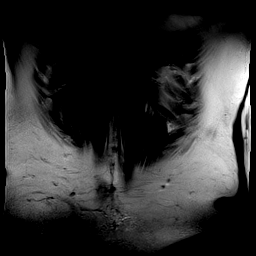

[Series 4: T2 · axial · 6.0mm · 1.22mm/px · 1 of 30 slices shown (2 of 3)]
[im 1/30]
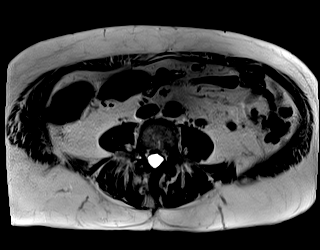

[Series 5: bSSFP · axial · 5.0mm · 1.25mm/px · 1 of 38 slices shown]
[im 1/38]
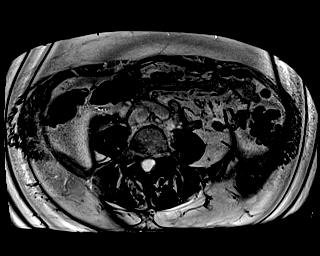

[Series 6: T1 · axial · 3.0mm · 1.19mm/px · z∈[-136,+77]mm · 6 of 144 slices shown]
[im 1/144]
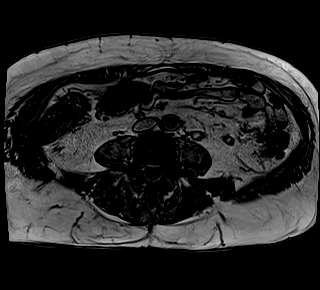
[im 29/144]
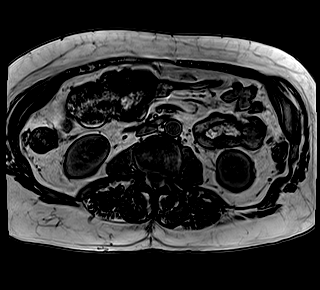
[im 58/144]
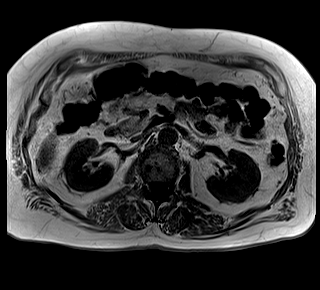
[im 86/144]
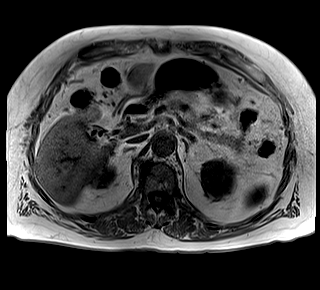
[im 115/144]
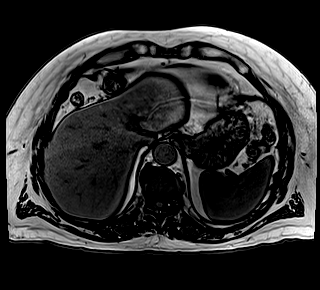
[im 144/144]
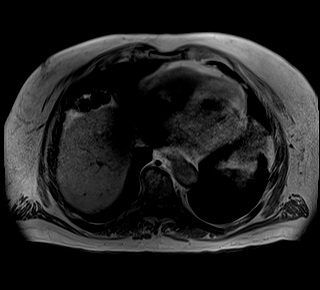

[Series 7: T2 · axial · 5.0mm · 1.48mm/px · 1 of 38 slices shown (3 of 3)]
[im 1/38]
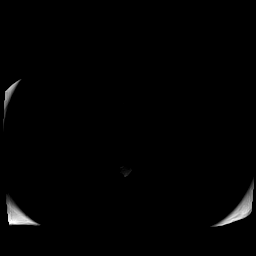

[Series 8: DWI · axial · 5.0mm · 1.42mm/px · z∈[-131,+91]mm · 4 of 114 slices shown (1 of 2)]
[im 1/114]
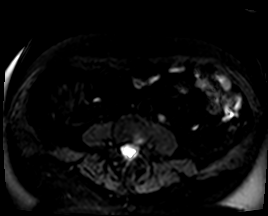
[im 38/114]
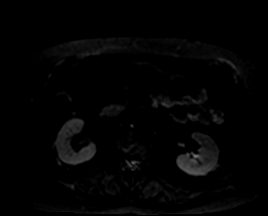
[im 76/114]
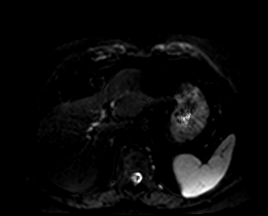
[im 114/114]
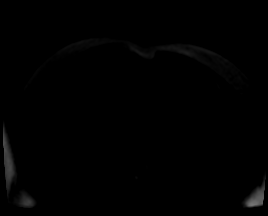

[Series 9: DWI · axial · 5.0mm · 1.42mm/px · 1 of 38 slices shown (2 of 2)]
[im 1/38]
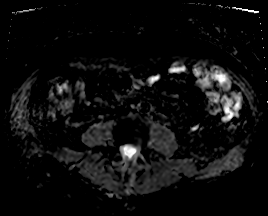

[Series 10: T1 dynamic · axial · non-contrast · 3.0mm · 1.25mm/px · z∈[-140,+73]mm · 3 of 72 slices shown]
[im 1/72]
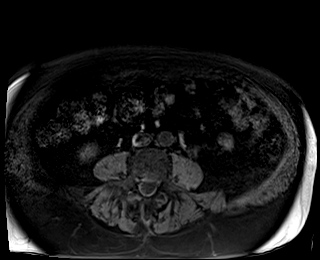
[im 36/72]
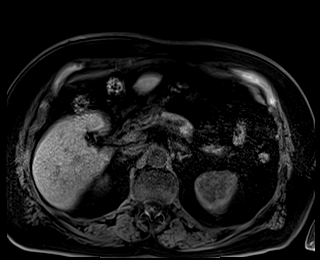
[im 72/72]
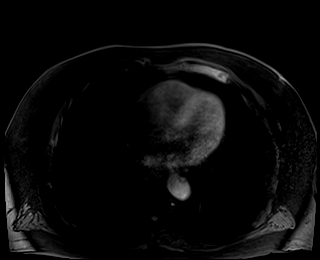

[Series 11: T1 dynamic post-contrast · axial · 3.0mm · 1.25mm/px · z∈[-140,+73]mm · 3 of 72 slices shown (1 of 10)]
[im 1/72]
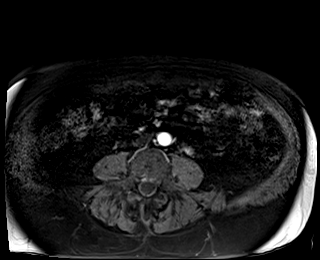
[im 36/72]
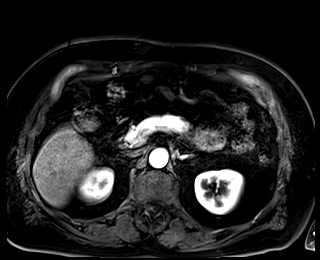
[im 72/72]
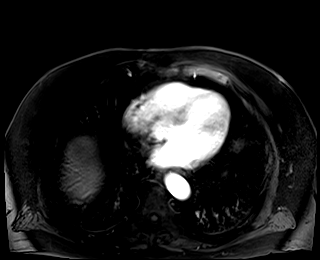

[Series 12: T1 dynamic post-contrast · axial · 3.0mm · 1.25mm/px · z∈[-140,+73]mm · 3 of 72 slices shown (2 of 10)]
[im 1/72]
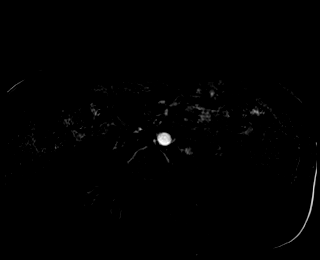
[im 36/72]
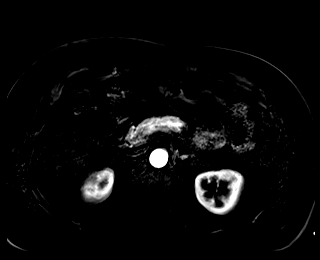
[im 72/72]
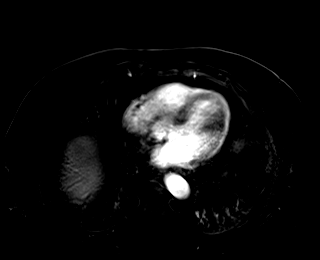

[Series 13: T1 dynamic post-contrast · axial · 3.0mm · 1.25mm/px · z∈[-140,+73]mm · 3 of 72 slices shown (3 of 10)]
[im 1/72]
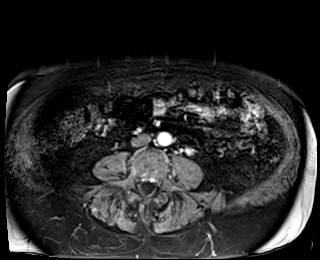
[im 36/72]
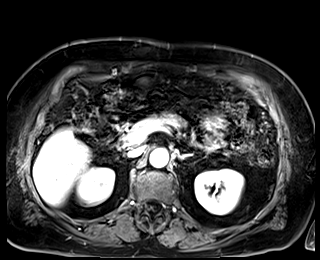
[im 72/72]
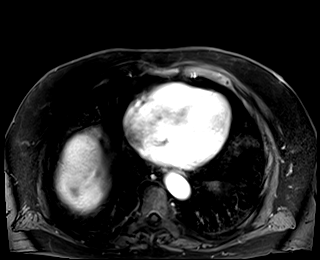

[Series 14: T1 dynamic post-contrast · axial · 3.0mm · 1.25mm/px · z∈[-140,+73]mm · 3 of 72 slices shown (4 of 10)]
[im 1/72]
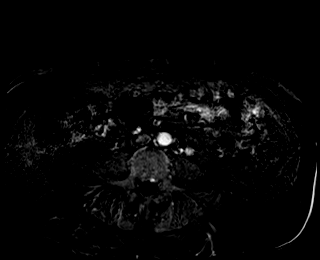
[im 36/72]
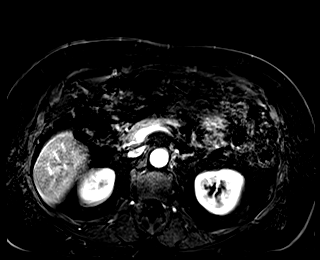
[im 72/72]
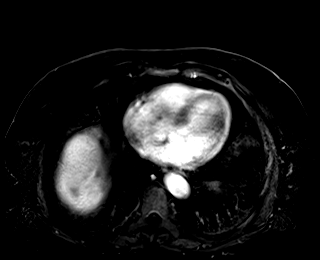

[Series 15: T1 dynamic post-contrast · axial · 3.0mm · 1.25mm/px · z∈[-140,+73]mm · 3 of 72 slices shown (5 of 10)]
[im 1/72]
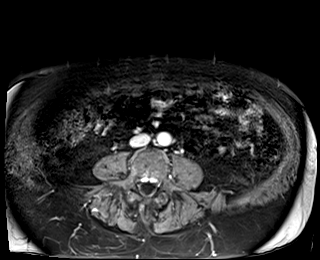
[im 36/72]
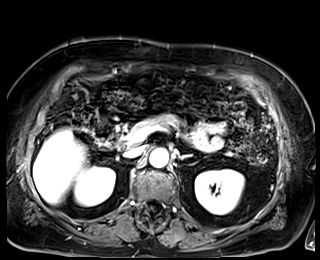
[im 72/72]
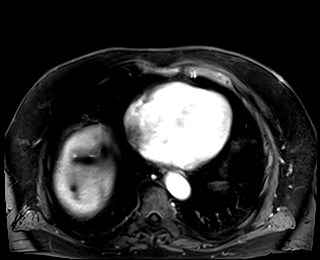

[Series 16: T1 dynamic post-contrast · axial · 3.0mm · 1.25mm/px · z∈[-140,+73]mm · 3 of 72 slices shown (6 of 10)]
[im 1/72]
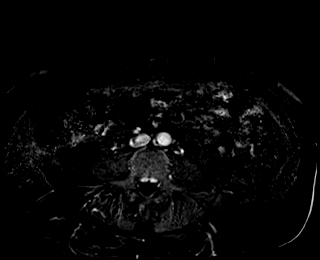
[im 36/72]
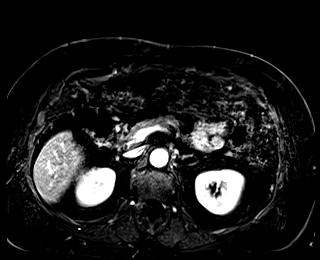
[im 72/72]
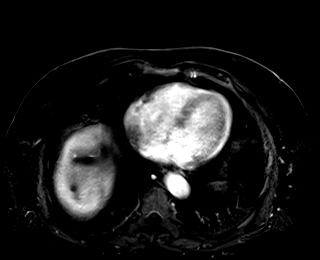

[Series 17: T1 dynamic post-contrast · coronal · 3.0mm · 1.25mm/px · 3 of 72 slices shown (7 of 10)]
[im 1/72]
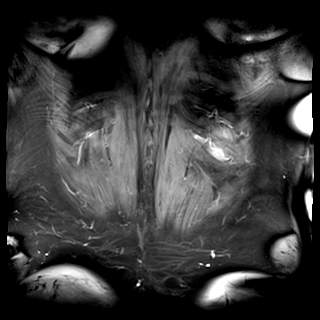
[im 36/72]
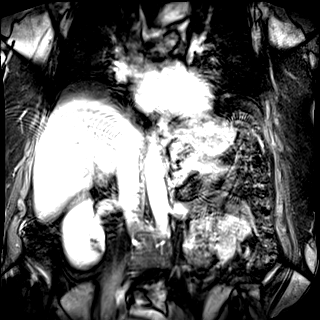
[im 72/72]
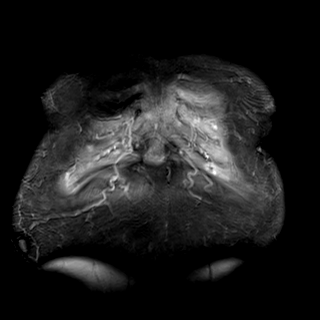

[Series 18: T1 dynamic post-contrast · axial · 3.0mm · 1.25mm/px · z∈[-140,+73]mm · 3 of 72 slices shown (8 of 10)]
[im 1/72]
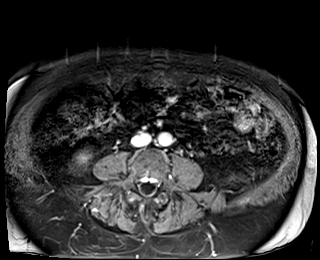
[im 36/72]
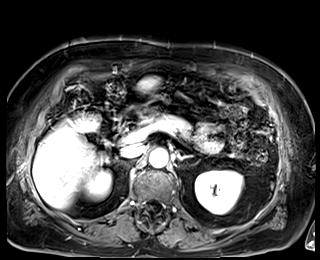
[im 72/72]
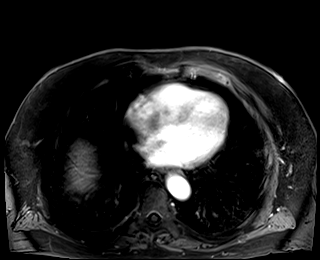

[Series 19: T1 dynamic post-contrast · axial · 3.0mm · 1.25mm/px · z∈[-140,+73]mm · 3 of 72 slices shown (9 of 10)]
[im 1/72]
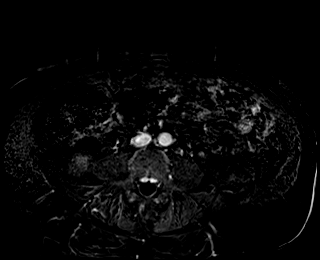
[im 36/72]
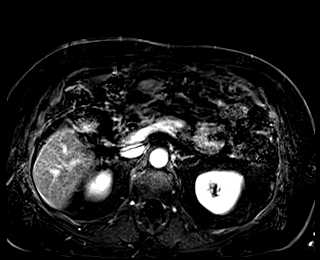
[im 72/72]
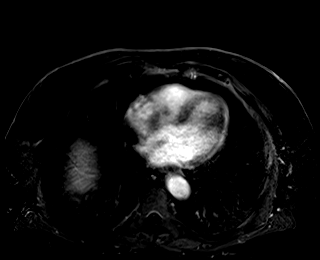

[Series 20: T1 dynamic post-contrast · coronal · 3.0mm · 1.25mm/px · 3 of 72 slices shown (10 of 10)]
[im 1/72]
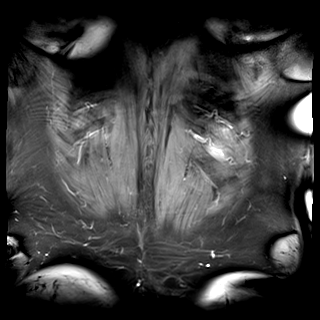
[im 36/72]
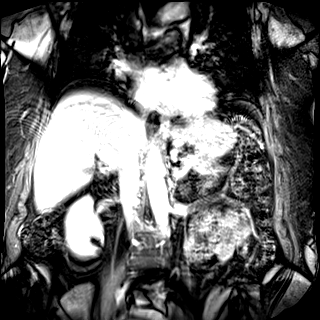
[im 72/72]
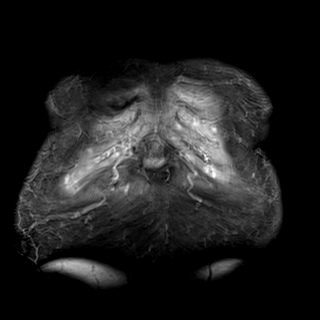

[48 of 48 positions shown; findings below may reference images not displayed]

FINDINGS: Lower chest: No acute findings.

Hepatobiliary: Hepatic steatosis noted. Benign appearing hemangioma
is identified within the central aspect of the left hepatic lobe
measuring 1.5 cm, image [DATE]. No additional enhancing liver lesions
identified. Status post cholecystectomy. No bile duct dilatation.

Pancreas: No mass, inflammatory changes, or other parenchymal
abnormality identified.

Spleen:  Within normal limits in size and appearance.

Adrenals/Urinary Tract: Normal appearance of the adrenal glands.
Corresponding to the ultrasound finding is a well-circumscribed T2
hyperintense cyst arising off the inferior pole of the right kidney.
This measures 1.8 cm and contains a thin internal area of hairline
septation without signs of mural nodularity on the T2 hyperintense
images, image [DATE]. On CT [DATE] this measured 1.3 cm. The
postcontrast images are motion limited which diminishing ability to
detect internal enhancement.

Several small cysts are noted within the inferior pole of the left
kidney measuring up to 0.8 cm.

No hydronephrosis identified bilaterally.

Stomach/Bowel: Visualized portions within the abdomen are
unremarkable.

Vascular/Lymphatic: No pathologically enlarged lymph nodes
identified. No abdominal aortic aneurysm demonstrated.

Other:  None.

Musculoskeletal: No suspicious bone lesions identified.
IMPRESSION: 1. The lesion arising off the inferior pole of the right kidney has
a thin internal area of hairline septation. However, the
postcontrast images are motion limited. This is favored to represent
a benign Bosniak category 2F lesion. Follow-up imaging in 6 to 12
months with repeat contrast enhanced MRI of the kidneys is
recommended.
2. Hepatic steatosis.
3. Benign appearing hemangioma within the left hepatic lobe.

## 2020-10-25 MED ORDER — GADOBENATE DIMEGLUMINE 529 MG/ML IV SOLN
20.0000 mL | Freq: Once | INTRAVENOUS | Status: AC | PRN
  Administered 2020-10-25: 20 mL via INTRAVENOUS

## 2020-11-13 DIAGNOSIS — L570 Actinic keratosis: Secondary | ICD-10-CM | POA: Diagnosis not present

## 2020-12-07 DIAGNOSIS — E1121 Type 2 diabetes mellitus with diabetic nephropathy: Secondary | ICD-10-CM | POA: Diagnosis not present

## 2020-12-07 DIAGNOSIS — I10 Essential (primary) hypertension: Secondary | ICD-10-CM | POA: Diagnosis not present

## 2020-12-07 DIAGNOSIS — R739 Hyperglycemia, unspecified: Secondary | ICD-10-CM | POA: Diagnosis not present

## 2020-12-07 DIAGNOSIS — E7849 Other hyperlipidemia: Secondary | ICD-10-CM | POA: Diagnosis not present

## 2020-12-07 DIAGNOSIS — Z1329 Encounter for screening for other suspected endocrine disorder: Secondary | ICD-10-CM | POA: Diagnosis not present

## 2020-12-07 DIAGNOSIS — E7800 Pure hypercholesterolemia, unspecified: Secondary | ICD-10-CM | POA: Diagnosis not present

## 2020-12-07 DIAGNOSIS — E7801 Familial hypercholesterolemia: Secondary | ICD-10-CM | POA: Diagnosis not present

## 2020-12-07 DIAGNOSIS — K2100 Gastro-esophageal reflux disease with esophagitis, without bleeding: Secondary | ICD-10-CM | POA: Diagnosis not present

## 2020-12-07 DIAGNOSIS — E782 Mixed hyperlipidemia: Secondary | ICD-10-CM | POA: Diagnosis not present

## 2020-12-12 DIAGNOSIS — Z23 Encounter for immunization: Secondary | ICD-10-CM | POA: Diagnosis not present

## 2020-12-12 DIAGNOSIS — Z8546 Personal history of malignant neoplasm of prostate: Secondary | ICD-10-CM | POA: Diagnosis not present

## 2020-12-12 DIAGNOSIS — K5909 Other constipation: Secondary | ICD-10-CM | POA: Diagnosis not present

## 2020-12-12 DIAGNOSIS — E7849 Other hyperlipidemia: Secondary | ICD-10-CM | POA: Diagnosis not present

## 2020-12-12 DIAGNOSIS — R69 Illness, unspecified: Secondary | ICD-10-CM | POA: Diagnosis not present

## 2020-12-12 DIAGNOSIS — I10 Essential (primary) hypertension: Secondary | ICD-10-CM | POA: Diagnosis not present

## 2020-12-12 DIAGNOSIS — K2100 Gastro-esophageal reflux disease with esophagitis, without bleeding: Secondary | ICD-10-CM | POA: Diagnosis not present

## 2020-12-12 DIAGNOSIS — E1121 Type 2 diabetes mellitus with diabetic nephropathy: Secondary | ICD-10-CM | POA: Diagnosis not present

## 2021-01-30 DIAGNOSIS — Z23 Encounter for immunization: Secondary | ICD-10-CM | POA: Diagnosis not present

## 2021-03-04 DIAGNOSIS — R634 Abnormal weight loss: Secondary | ICD-10-CM | POA: Diagnosis not present

## 2021-03-04 DIAGNOSIS — R110 Nausea: Secondary | ICD-10-CM | POA: Diagnosis not present

## 2021-03-04 DIAGNOSIS — B001 Herpesviral vesicular dermatitis: Secondary | ICD-10-CM | POA: Diagnosis not present

## 2021-03-07 DIAGNOSIS — Z23 Encounter for immunization: Secondary | ICD-10-CM | POA: Diagnosis not present

## 2021-04-03 DIAGNOSIS — Z6827 Body mass index (BMI) 27.0-27.9, adult: Secondary | ICD-10-CM | POA: Diagnosis not present

## 2021-04-03 DIAGNOSIS — R109 Unspecified abdominal pain: Secondary | ICD-10-CM | POA: Diagnosis not present

## 2021-04-10 DIAGNOSIS — R059 Cough, unspecified: Secondary | ICD-10-CM | POA: Diagnosis not present

## 2021-04-10 DIAGNOSIS — Z20828 Contact with and (suspected) exposure to other viral communicable diseases: Secondary | ICD-10-CM | POA: Diagnosis not present

## 2021-06-03 DIAGNOSIS — E7849 Other hyperlipidemia: Secondary | ICD-10-CM | POA: Diagnosis not present

## 2021-06-03 DIAGNOSIS — F17210 Nicotine dependence, cigarettes, uncomplicated: Secondary | ICD-10-CM | POA: Diagnosis not present

## 2021-06-03 DIAGNOSIS — Z6827 Body mass index (BMI) 27.0-27.9, adult: Secondary | ICD-10-CM | POA: Diagnosis not present

## 2021-06-03 DIAGNOSIS — E1121 Type 2 diabetes mellitus with diabetic nephropathy: Secondary | ICD-10-CM | POA: Diagnosis not present

## 2021-06-03 DIAGNOSIS — F411 Generalized anxiety disorder: Secondary | ICD-10-CM | POA: Diagnosis not present

## 2021-06-03 DIAGNOSIS — E7801 Familial hypercholesterolemia: Secondary | ICD-10-CM | POA: Diagnosis not present

## 2021-06-03 DIAGNOSIS — K2100 Gastro-esophageal reflux disease with esophagitis, without bleeding: Secondary | ICD-10-CM | POA: Diagnosis not present

## 2021-06-03 DIAGNOSIS — E782 Mixed hyperlipidemia: Secondary | ICD-10-CM | POA: Diagnosis not present

## 2021-06-03 DIAGNOSIS — Z0001 Encounter for general adult medical examination with abnormal findings: Secondary | ICD-10-CM | POA: Diagnosis not present

## 2021-06-03 DIAGNOSIS — R69 Illness, unspecified: Secondary | ICD-10-CM | POA: Diagnosis not present

## 2021-06-03 DIAGNOSIS — E7800 Pure hypercholesterolemia, unspecified: Secondary | ICD-10-CM | POA: Diagnosis not present

## 2021-06-03 DIAGNOSIS — I10 Essential (primary) hypertension: Secondary | ICD-10-CM | POA: Diagnosis not present

## 2021-06-10 DIAGNOSIS — Z23 Encounter for immunization: Secondary | ICD-10-CM | POA: Diagnosis not present

## 2021-06-10 DIAGNOSIS — E1121 Type 2 diabetes mellitus with diabetic nephropathy: Secondary | ICD-10-CM | POA: Diagnosis not present

## 2021-06-10 DIAGNOSIS — K2100 Gastro-esophageal reflux disease with esophagitis, without bleeding: Secondary | ICD-10-CM | POA: Diagnosis not present

## 2021-06-10 DIAGNOSIS — I10 Essential (primary) hypertension: Secondary | ICD-10-CM | POA: Diagnosis not present

## 2021-06-10 DIAGNOSIS — E7849 Other hyperlipidemia: Secondary | ICD-10-CM | POA: Diagnosis not present

## 2021-06-10 DIAGNOSIS — K5909 Other constipation: Secondary | ICD-10-CM | POA: Diagnosis not present

## 2021-06-10 DIAGNOSIS — Z8546 Personal history of malignant neoplasm of prostate: Secondary | ICD-10-CM | POA: Diagnosis not present

## 2021-06-10 DIAGNOSIS — Z6827 Body mass index (BMI) 27.0-27.9, adult: Secondary | ICD-10-CM | POA: Diagnosis not present

## 2021-07-18 ENCOUNTER — Other Ambulatory Visit: Payer: Self-pay | Admitting: Urology

## 2021-07-18 DIAGNOSIS — D49511 Neoplasm of unspecified behavior of right kidney: Secondary | ICD-10-CM

## 2021-07-23 DIAGNOSIS — E669 Obesity, unspecified: Secondary | ICD-10-CM | POA: Diagnosis not present

## 2021-07-23 DIAGNOSIS — N529 Male erectile dysfunction, unspecified: Secondary | ICD-10-CM | POA: Diagnosis not present

## 2021-07-23 DIAGNOSIS — I10 Essential (primary) hypertension: Secondary | ICD-10-CM | POA: Diagnosis not present

## 2021-07-23 DIAGNOSIS — Z803 Family history of malignant neoplasm of breast: Secondary | ICD-10-CM | POA: Diagnosis not present

## 2021-07-23 DIAGNOSIS — M1990 Unspecified osteoarthritis, unspecified site: Secondary | ICD-10-CM | POA: Diagnosis not present

## 2021-07-23 DIAGNOSIS — Z008 Encounter for other general examination: Secondary | ICD-10-CM | POA: Diagnosis not present

## 2021-07-23 DIAGNOSIS — G8929 Other chronic pain: Secondary | ICD-10-CM | POA: Diagnosis not present

## 2021-07-23 DIAGNOSIS — R69 Illness, unspecified: Secondary | ICD-10-CM | POA: Diagnosis not present

## 2021-07-23 DIAGNOSIS — Z6831 Body mass index (BMI) 31.0-31.9, adult: Secondary | ICD-10-CM | POA: Diagnosis not present

## 2021-07-23 DIAGNOSIS — E785 Hyperlipidemia, unspecified: Secondary | ICD-10-CM | POA: Diagnosis not present

## 2021-07-23 DIAGNOSIS — Z79899 Other long term (current) drug therapy: Secondary | ICD-10-CM | POA: Diagnosis not present

## 2021-07-23 DIAGNOSIS — J309 Allergic rhinitis, unspecified: Secondary | ICD-10-CM | POA: Diagnosis not present

## 2021-07-23 DIAGNOSIS — K219 Gastro-esophageal reflux disease without esophagitis: Secondary | ICD-10-CM | POA: Diagnosis not present

## 2021-08-04 ENCOUNTER — Other Ambulatory Visit: Payer: Self-pay

## 2021-08-04 ENCOUNTER — Ambulatory Visit
Admission: RE | Admit: 2021-08-04 | Discharge: 2021-08-04 | Disposition: A | Discharge status: Home / Self Care | Payer: Medicare HMO | Source: Ambulatory Visit | Attending: Urology | Admitting: Urology

## 2021-08-04 DIAGNOSIS — D49511 Neoplasm of unspecified behavior of right kidney: Secondary | ICD-10-CM

## 2021-08-04 DIAGNOSIS — N2889 Other specified disorders of kidney and ureter: Secondary | ICD-10-CM | POA: Diagnosis not present

## 2021-08-04 IMAGING — MR MR ABDOMEN WO/W CM
13 of 18 series · 33 of 48 positions shown · IV contrast (multihance)
Comparison: MRI abdomen [DATE]

CLINICAL DATA: Right kidney lesion follow-up

EXAM:
MRI ABDOMEN WITHOUT AND WITH CONTRAST
TECHNIQUE: Multiplanar multisequence MR imaging of the abdomen was performed
both before and after the administration of intravenous contrast.
CONTRAST:  19mL MULTIHANCE GADOBENATE DIMEGLUMINE 529 MG/ML IV SOLN

[Series 3: T2 · axial · 6.5mm · 0.78mm/px · z∈[-129,+113]mm · 2 of 32 slices shown (1 of 3)]
[im 1/32]
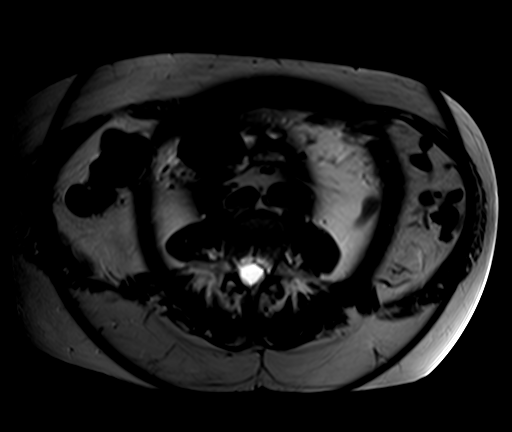
[im 32/32]
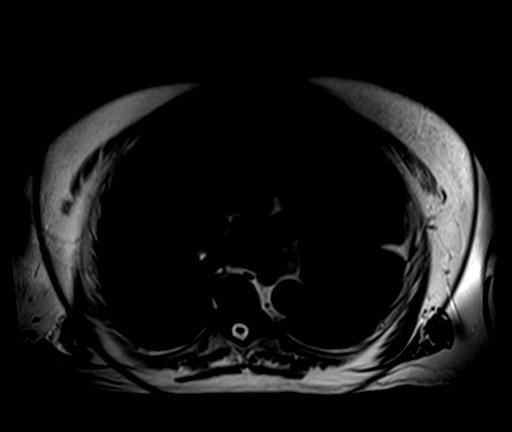

[Series 4: T2 · coronal · 5.0mm · 1.56mm/px · 2 of 40 slices shown (2 of 3)]
[im 1/40]
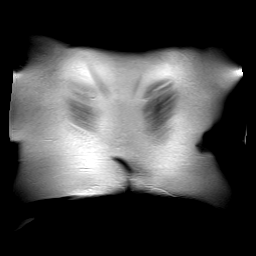
[im 40/40]
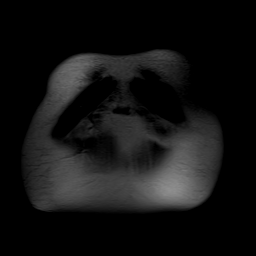

[Series 5: axial tru fisp · axial · 4.0mm · 1.56mm/px · z∈[-130,+109]mm · 3 of 47 slices shown (1 of 2)]
[im 1/47]
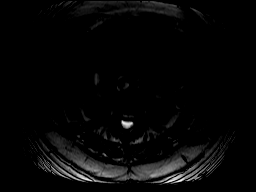
[im 24/47]
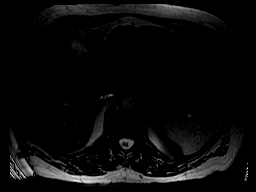
[im 47/47]
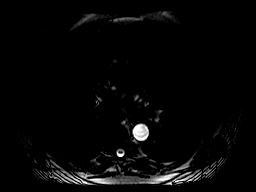

[Series 6: [id number] · axial · 6.0mm · 2.08mm/px · z∈[-139,+119]mm · 4 of 102 slices shown]
[im 1/102]
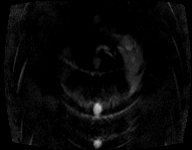
[im 34/102]
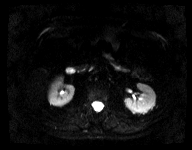
[im 68/102]
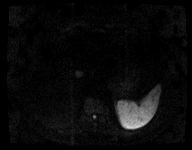
[im 102/102]
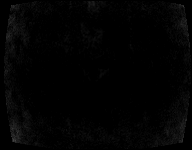

[Series 7: [id number] · axial · 6.0mm · 2.08mm/px · 1 of 34 slices shown]
[im 1/34]
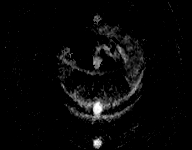

[Series 8: T2 · axial · 5.0mm · 1.56mm/px · 1 of 39 slices shown (3 of 3)]
[im 1/39]
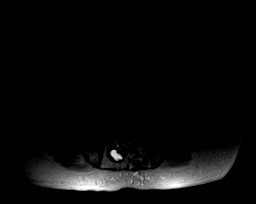

[Series 9: axial in out · axial · 5.5mm · 0.78mm/px · z∈[-186,+54]mm · 3 of 78 slices shown]
[im 1/78]
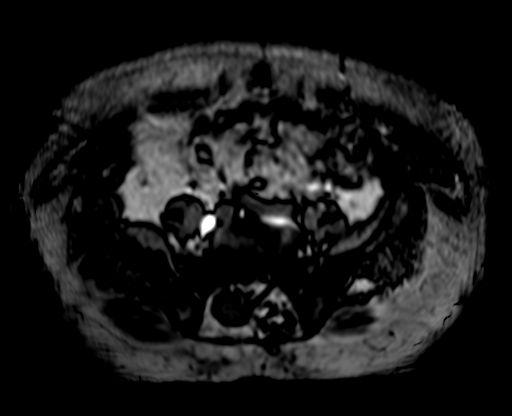
[im 39/78]
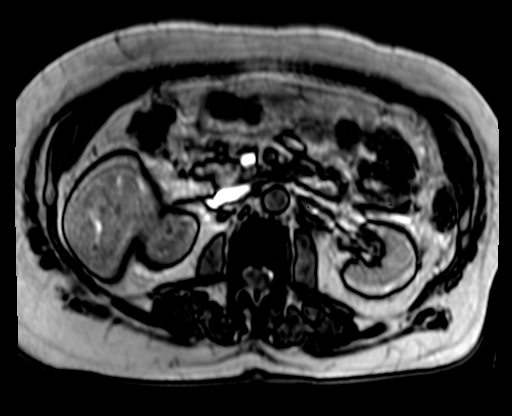
[im 78/78]
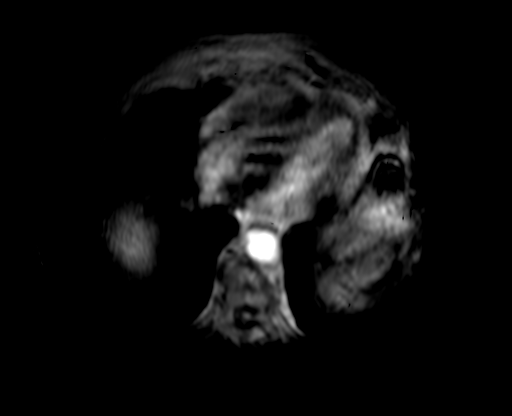

[Series 10: axial tru fisp · axial · 4.0mm · 1.56mm/px · z∈[-182,+57]mm · 2 of 47 slices shown (2 of 2)]
[im 1/47]
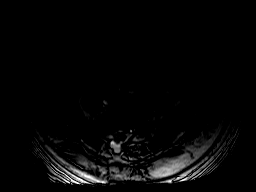
[im 47/47]
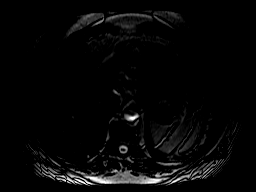

[Series 11: T1 dynamic · axial · non-contrast · 2.5mm · 0.78mm/px · z∈[-190,+68]mm · 3 of 104 slices shown]
[im 1/104]
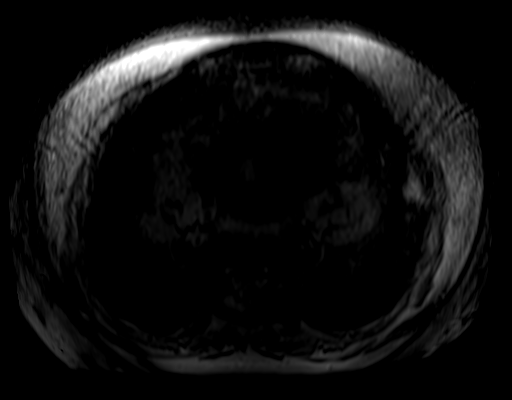
[im 52/104]
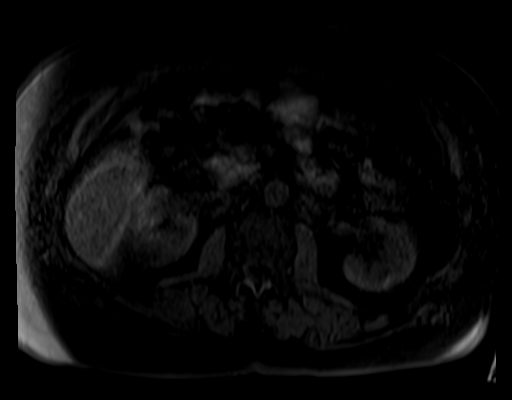
[im 104/104]
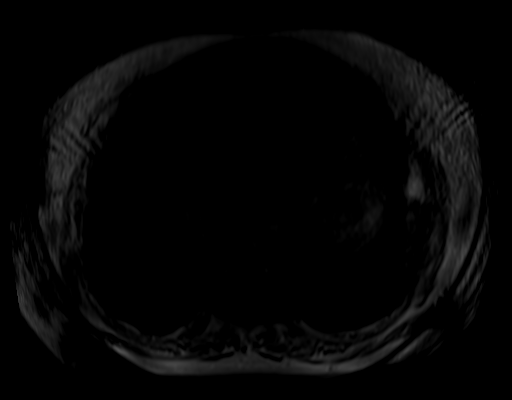

[Series 12: post 25 sec · axial · 2.5mm · 0.78mm/px · z∈[-190,+68]mm · 3 of 104 slices shown]
[im 1/104]
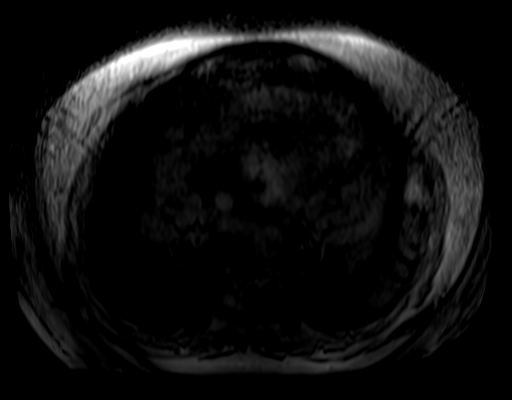
[im 52/104]
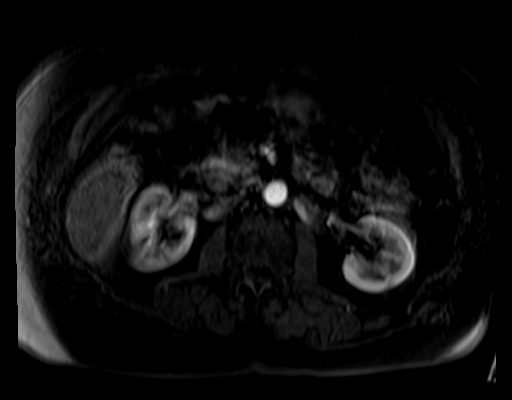
[im 104/104]
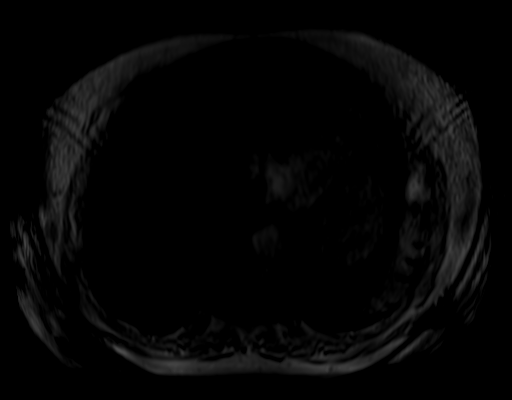

[Series 13: post 25 sec_sub · axial · 2.5mm · 0.78mm/px · z∈[-190,+68]mm · 3 of 104 slices shown]
[im 1/104]
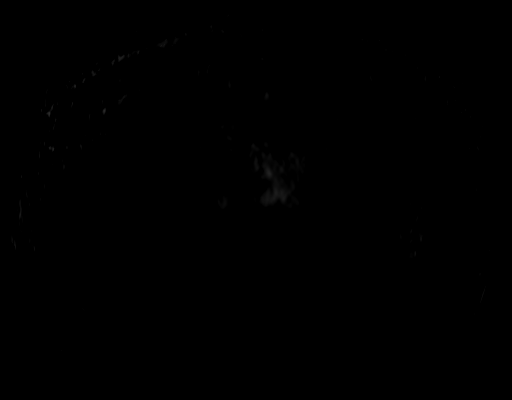
[im 52/104]
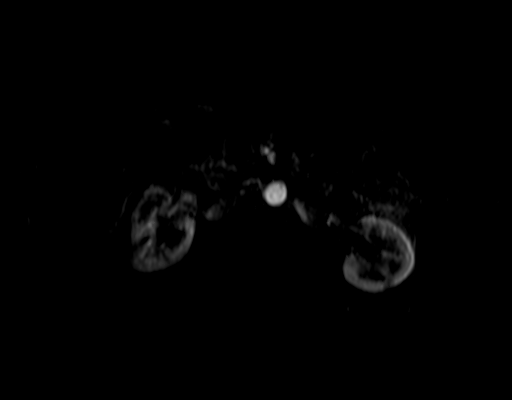
[im 104/104]
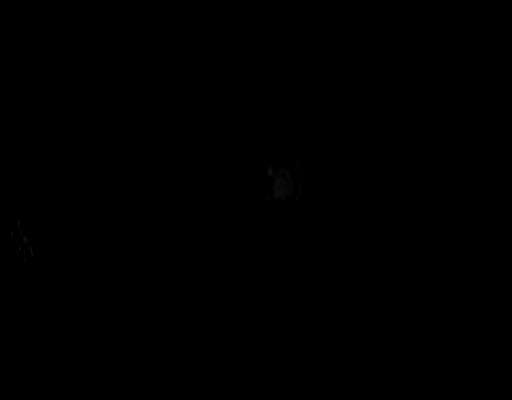

[Series 14: post 45 sec · axial · 2.5mm · 0.78mm/px · z∈[-190,+68]mm · 3 of 104 slices shown]
[im 1/104]
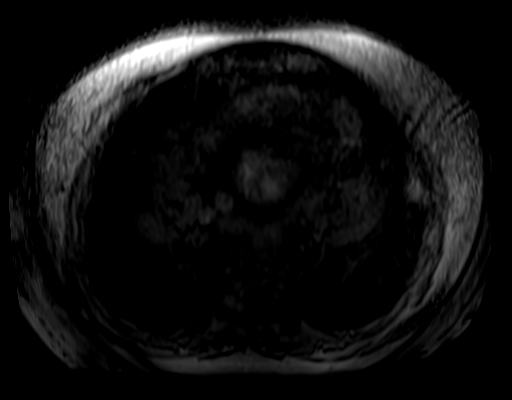
[im 52/104]
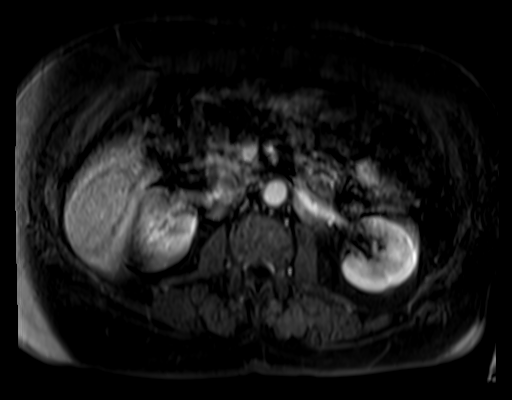
[im 104/104]
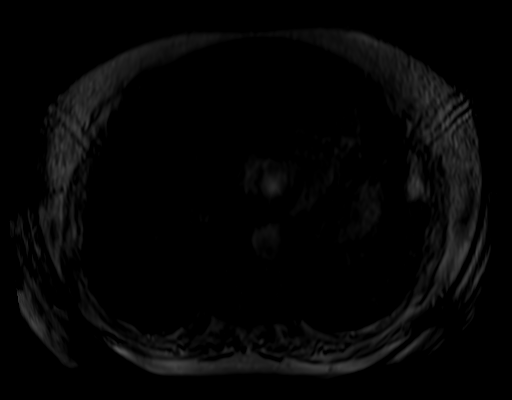

[Series 15: post 45 sec_sub · axial · 2.5mm · 0.78mm/px · z∈[-190,+68]mm · 3 of 104 slices shown]
[im 1/104]
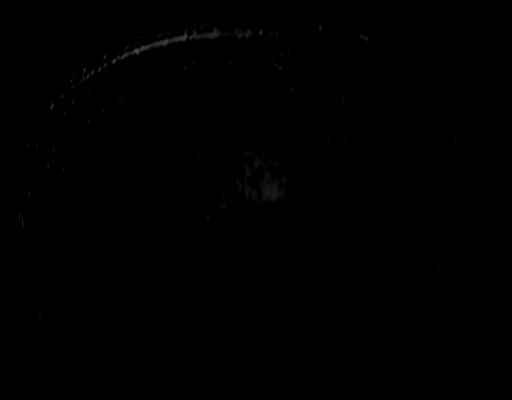
[im 52/104]
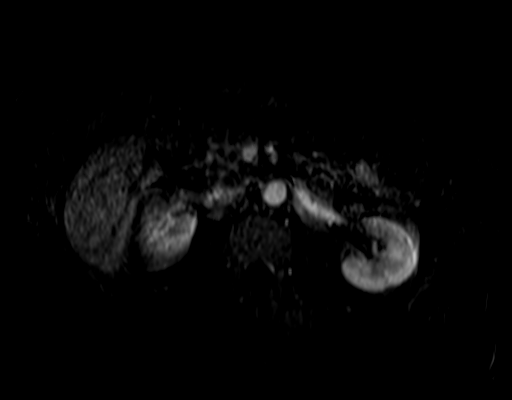
[im 104/104]
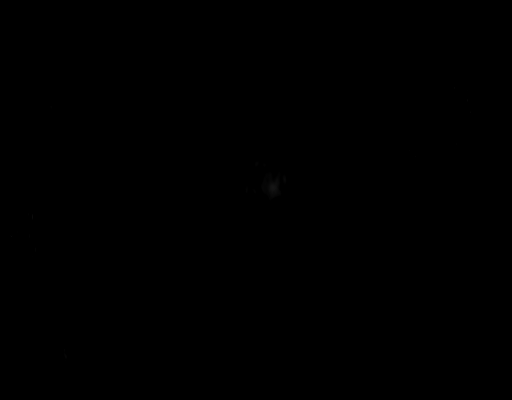

[33 of 48 positions shown; findings below may reference images not displayed]

FINDINGS: The study is limited due to motion.

Lower chest: No acute findings.

Hepatobiliary: Liver is normal in size and contour with no definite
suspicious mass identified. Evidence of mild hepatic steatosis.
Gallbladder appears to be surgically absent. No biliary ductal
dilatation identified.

Pancreas:  No obvious mass or ductal dilatation.

Spleen:  Within normal limits in size and appearance.

Adrenals/Urinary Tract: Adrenal glands appear grossly normal.
Redemonstration of a 2.1 x 1.9 cm cyst with thin septation in the
lower pole right kidney. No obvious enhancing nodularity
appreciated, given limitations of motion. A few additional smaller
renal cysts identified bilaterally. No hydronephrosis.

Stomach/Bowel: No evidence of bowel obstruction. Colonic
diverticulosis.

Vascular/Lymphatic: No pathologically enlarged lymph nodes
identified. No abdominal aortic aneurysm demonstrated.

Other:  No ascites.

Musculoskeletal: No suspicious bone lesions identified.
IMPRESSION: 1. Study is significantly limited due to motion. Consider future
follow-up exams using renal mass protocol CT for better evaluation.
2. Grossly stable size and appearance of a mildly complex cyst with
thin septation in the lower pole right kidney. Consider follow-up CT
renal mass protocol in 6 months.
3. Evidence of mild hepatic steatosis.
4. Colonic diverticulosis.

## 2021-08-04 MED ORDER — GADOBENATE DIMEGLUMINE 529 MG/ML IV SOLN
19.0000 mL | Freq: Once | INTRAVENOUS | Status: AC | PRN
  Administered 2021-08-04: 19 mL via INTRAVENOUS

## 2021-09-06 DIAGNOSIS — D49511 Neoplasm of unspecified behavior of right kidney: Secondary | ICD-10-CM | POA: Diagnosis not present

## 2021-09-06 DIAGNOSIS — C61 Malignant neoplasm of prostate: Secondary | ICD-10-CM | POA: Diagnosis not present

## 2021-10-28 DIAGNOSIS — M5416 Radiculopathy, lumbar region: Secondary | ICD-10-CM | POA: Diagnosis not present

## 2021-11-13 DIAGNOSIS — L570 Actinic keratosis: Secondary | ICD-10-CM | POA: Diagnosis not present

## 2021-11-21 DIAGNOSIS — M5416 Radiculopathy, lumbar region: Secondary | ICD-10-CM | POA: Diagnosis not present

## 2021-12-02 DIAGNOSIS — K2100 Gastro-esophageal reflux disease with esophagitis, without bleeding: Secondary | ICD-10-CM | POA: Diagnosis not present

## 2021-12-02 DIAGNOSIS — E7849 Other hyperlipidemia: Secondary | ICD-10-CM | POA: Diagnosis not present

## 2021-12-02 DIAGNOSIS — R739 Hyperglycemia, unspecified: Secondary | ICD-10-CM | POA: Diagnosis not present

## 2021-12-02 DIAGNOSIS — I10 Essential (primary) hypertension: Secondary | ICD-10-CM | POA: Diagnosis not present

## 2021-12-02 DIAGNOSIS — E7801 Familial hypercholesterolemia: Secondary | ICD-10-CM | POA: Diagnosis not present

## 2021-12-02 DIAGNOSIS — E782 Mixed hyperlipidemia: Secondary | ICD-10-CM | POA: Diagnosis not present

## 2021-12-02 DIAGNOSIS — E7800 Pure hypercholesterolemia, unspecified: Secondary | ICD-10-CM | POA: Diagnosis not present

## 2021-12-02 DIAGNOSIS — E1121 Type 2 diabetes mellitus with diabetic nephropathy: Secondary | ICD-10-CM | POA: Diagnosis not present

## 2021-12-02 DIAGNOSIS — Z0001 Encounter for general adult medical examination with abnormal findings: Secondary | ICD-10-CM | POA: Diagnosis not present

## 2021-12-11 DIAGNOSIS — E1121 Type 2 diabetes mellitus with diabetic nephropathy: Secondary | ICD-10-CM | POA: Diagnosis not present

## 2021-12-11 DIAGNOSIS — E7849 Other hyperlipidemia: Secondary | ICD-10-CM | POA: Diagnosis not present

## 2021-12-11 DIAGNOSIS — Z6828 Body mass index (BMI) 28.0-28.9, adult: Secondary | ICD-10-CM | POA: Diagnosis not present

## 2021-12-11 DIAGNOSIS — Z0001 Encounter for general adult medical examination with abnormal findings: Secondary | ICD-10-CM | POA: Diagnosis not present

## 2021-12-11 DIAGNOSIS — E782 Mixed hyperlipidemia: Secondary | ICD-10-CM | POA: Diagnosis not present

## 2021-12-11 DIAGNOSIS — K5900 Constipation, unspecified: Secondary | ICD-10-CM | POA: Diagnosis not present

## 2021-12-11 DIAGNOSIS — K219 Gastro-esophageal reflux disease without esophagitis: Secondary | ICD-10-CM | POA: Diagnosis not present

## 2021-12-16 DIAGNOSIS — M47816 Spondylosis without myelopathy or radiculopathy, lumbar region: Secondary | ICD-10-CM | POA: Diagnosis not present

## 2021-12-18 DIAGNOSIS — E119 Type 2 diabetes mellitus without complications: Secondary | ICD-10-CM | POA: Diagnosis not present

## 2021-12-18 DIAGNOSIS — H2513 Age-related nuclear cataract, bilateral: Secondary | ICD-10-CM | POA: Diagnosis not present

## 2022-01-02 DIAGNOSIS — M47816 Spondylosis without myelopathy or radiculopathy, lumbar region: Secondary | ICD-10-CM | POA: Diagnosis not present

## 2022-02-13 DIAGNOSIS — Z23 Encounter for immunization: Secondary | ICD-10-CM | POA: Diagnosis not present

## 2022-02-13 DIAGNOSIS — Z6832 Body mass index (BMI) 32.0-32.9, adult: Secondary | ICD-10-CM | POA: Diagnosis not present

## 2022-02-25 DIAGNOSIS — M47816 Spondylosis without myelopathy or radiculopathy, lumbar region: Secondary | ICD-10-CM | POA: Diagnosis not present

## 2022-03-17 DIAGNOSIS — R030 Elevated blood-pressure reading, without diagnosis of hypertension: Secondary | ICD-10-CM | POA: Diagnosis not present

## 2022-03-17 DIAGNOSIS — S20219A Contusion of unspecified front wall of thorax, initial encounter: Secondary | ICD-10-CM | POA: Diagnosis not present

## 2022-03-28 DIAGNOSIS — Z23 Encounter for immunization: Secondary | ICD-10-CM | POA: Diagnosis not present

## 2022-04-02 DIAGNOSIS — R030 Elevated blood-pressure reading, without diagnosis of hypertension: Secondary | ICD-10-CM | POA: Diagnosis not present

## 2022-04-02 DIAGNOSIS — R21 Rash and other nonspecific skin eruption: Secondary | ICD-10-CM | POA: Diagnosis not present

## 2022-04-02 DIAGNOSIS — Z6828 Body mass index (BMI) 28.0-28.9, adult: Secondary | ICD-10-CM | POA: Diagnosis not present

## 2022-04-08 DIAGNOSIS — J029 Acute pharyngitis, unspecified: Secondary | ICD-10-CM | POA: Diagnosis not present

## 2022-04-08 DIAGNOSIS — Z20828 Contact with and (suspected) exposure to other viral communicable diseases: Secondary | ICD-10-CM | POA: Diagnosis not present

## 2022-04-08 DIAGNOSIS — R21 Rash and other nonspecific skin eruption: Secondary | ICD-10-CM | POA: Diagnosis not present

## 2022-04-08 DIAGNOSIS — R300 Dysuria: Secondary | ICD-10-CM | POA: Diagnosis not present

## 2022-05-16 DIAGNOSIS — R030 Elevated blood-pressure reading, without diagnosis of hypertension: Secondary | ICD-10-CM | POA: Diagnosis not present

## 2022-05-16 DIAGNOSIS — Z6828 Body mass index (BMI) 28.0-28.9, adult: Secondary | ICD-10-CM | POA: Diagnosis not present

## 2022-05-16 DIAGNOSIS — J320 Chronic maxillary sinusitis: Secondary | ICD-10-CM | POA: Diagnosis not present

## 2022-05-28 ENCOUNTER — Encounter: Payer: Self-pay | Admitting: Internal Medicine

## 2022-06-09 DIAGNOSIS — E1121 Type 2 diabetes mellitus with diabetic nephropathy: Secondary | ICD-10-CM | POA: Diagnosis not present

## 2022-06-09 DIAGNOSIS — E7801 Familial hypercholesterolemia: Secondary | ICD-10-CM | POA: Diagnosis not present

## 2022-06-09 DIAGNOSIS — E7849 Other hyperlipidemia: Secondary | ICD-10-CM | POA: Diagnosis not present

## 2022-06-09 DIAGNOSIS — I10 Essential (primary) hypertension: Secondary | ICD-10-CM | POA: Diagnosis not present

## 2022-06-13 DIAGNOSIS — I10 Essential (primary) hypertension: Secondary | ICD-10-CM | POA: Diagnosis not present

## 2022-06-13 DIAGNOSIS — E7849 Other hyperlipidemia: Secondary | ICD-10-CM | POA: Diagnosis not present

## 2022-06-13 DIAGNOSIS — J3089 Other allergic rhinitis: Secondary | ICD-10-CM | POA: Diagnosis not present

## 2022-06-13 DIAGNOSIS — Z6829 Body mass index (BMI) 29.0-29.9, adult: Secondary | ICD-10-CM | POA: Diagnosis not present

## 2022-06-13 DIAGNOSIS — Z8546 Personal history of malignant neoplasm of prostate: Secondary | ICD-10-CM | POA: Diagnosis not present

## 2022-06-13 DIAGNOSIS — R69 Illness, unspecified: Secondary | ICD-10-CM | POA: Diagnosis not present

## 2022-06-13 DIAGNOSIS — K2100 Gastro-esophageal reflux disease with esophagitis, without bleeding: Secondary | ICD-10-CM | POA: Diagnosis not present

## 2022-06-13 DIAGNOSIS — E1121 Type 2 diabetes mellitus with diabetic nephropathy: Secondary | ICD-10-CM | POA: Diagnosis not present

## 2022-06-13 DIAGNOSIS — K5909 Other constipation: Secondary | ICD-10-CM | POA: Diagnosis not present

## 2022-06-13 DIAGNOSIS — Z23 Encounter for immunization: Secondary | ICD-10-CM | POA: Diagnosis not present

## 2022-06-13 DIAGNOSIS — Z0001 Encounter for general adult medical examination with abnormal findings: Secondary | ICD-10-CM | POA: Diagnosis not present

## 2022-06-13 DIAGNOSIS — F411 Generalized anxiety disorder: Secondary | ICD-10-CM | POA: Diagnosis not present

## 2022-06-13 DIAGNOSIS — S39012A Strain of muscle, fascia and tendon of lower back, initial encounter: Secondary | ICD-10-CM | POA: Diagnosis not present

## 2022-07-03 DIAGNOSIS — M25551 Pain in right hip: Secondary | ICD-10-CM | POA: Diagnosis not present

## 2022-07-03 DIAGNOSIS — M5441 Lumbago with sciatica, right side: Secondary | ICD-10-CM | POA: Diagnosis not present

## 2022-07-25 ENCOUNTER — Ambulatory Visit: Payer: Medicare HMO | Admitting: Internal Medicine

## 2022-07-29 ENCOUNTER — Encounter: Payer: Self-pay | Admitting: Internal Medicine

## 2022-07-29 ENCOUNTER — Ambulatory Visit: Payer: Medicare HMO | Admitting: Internal Medicine

## 2022-07-29 VITALS — BP 119/78 | HR 85 | Temp 98.0°F | Ht 70.0 in | Wt 212.0 lb

## 2022-07-29 DIAGNOSIS — K5909 Other constipation: Secondary | ICD-10-CM

## 2022-07-29 DIAGNOSIS — K219 Gastro-esophageal reflux disease without esophagitis: Secondary | ICD-10-CM | POA: Diagnosis not present

## 2022-07-29 NOTE — Patient Instructions (Signed)
It was good to see you again today!  As discussed, given your history of polyps and the fact that you remain in fairly good health, we should do 1 more surveillance colonoscopy.  We will schedule a surveillance colonoscopy (history of polyps) ASA 2 at the hospital in the near future.  Continue Benefiber daily  May continue prune juice daily.  Might consider taking kiwi it is very effective here (scientifically proven).  1-2 kiwi fruit a day has only a fraction of the sugar as a glass of prune juice.  Also, recommend taking a dose of MiraLAX on a Monday and Thursday.  Hopefully this will keep you moving without causing loose stools or any other issues.

## 2022-07-29 NOTE — H&P (View-Only) (Signed)
  Primary Care Physician:  Nash, Jacob W, MD Primary Gastroenterologist:  Dr. Chasya Nash  Pre-Procedure History & Physical: HPI:  Jacob Nash is a 76 y.o. male here for follow-up constipation GERD.  GERD doing well on omeprazole 20 mg daily takes it 30 minutes before lunch.  Still has issues with constipation takes MiraLAX sporadically Benefiber daily prune juice frequently as well.  Go 2 to 3 days without a bowel movement then takes a dose of MiraLAX.  If he takes MiraLAX daily he has issues with fecal seepage  Reportedly multiple colonic polyps removed in the distant past reportedly negative colonoscopy elsewhere 2014.  I performed colonoscopy 2019 found no polyps.  Jacob Nash continues to enjoy good health at 76.  He has very manageable comorbidities and remains very active.  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 Maguire Killmer: Long redundant colon, diverticulosis, next colonoscopy in 5 years given personal history of colon polyps   ESOPHAGOGASTRODUODENOSCOPY N/A 07/03/2017   Dr. Cathyrn Deas: Small hiatal hernia   HERNIA REPAIR  03-21-2009    LEFT INGUINAL   INCISIONAL HERNIA REPAIR N/A 08/11/2016   Procedure: INCISIONAL HERNIORRHAPHY  WITH MESH;  Surgeon: Mark Jenkins, MD;  Location: [facility];  Service: General;  Laterality: N/A;   INGUINAL HERNIA REPAIR Right 10/21/2017   Procedure: HERNIA REPAIR INGUINAL  ADULT WITH MESH;  Surgeon: Jenkins, Mark, MD;  Location: [facility];  Service: General;  Laterality: Right;   KNEE ARTHROSCOPY Right    LYMPHADENECTOMY Bilateral 10/11/2015   Procedure: BILATERAL PELVIC LYMPHADENECTOMY;  Surgeon: Lester Borden, MD;  Location: [facility];  Service: Urology;  Laterality: Bilateral;   PROSTATE BIOPSY  07-2015   PROSTATE SURGERY  09/2015   ROBOT ASSISTED LAPAROSCOPIC RADICAL PROSTATECTOMY N/A 10/11/2015   Procedure: XI ROBOTIC ASSISTED LAPAROSCOPIC RADICAL PROSTATECTOMY LEVEL 2;  Surgeon: Lester Borden, MD;  Location: [facility];  Service: Urology;  Laterality: N/A;   SHOULDER SURGERY Right 06-06-14   MUSCLE TEAR    Prior to Admission medications   Medication Sig Start Date End Date Taking? Authorizing Provider  ALPRAZolam (XANAX) 0.5 MG tablet Take 0.5 mg by mouth 2 (two) times daily.  09/19/15  Yes [provider]  Cholecalciferol (VITAMIN D) 2000 units tablet Take 2,000 Units by mouth daily after lunch.   Yes [provider]  fluticasone (FLONASE) 50 MCG/ACT nasal spray Place 2 sprays into both nostrils daily. 09/12/15  Yes [provider]  lisinopril (PRINIVIL,ZESTRIL) 40 MG tablet Take 40 mg by mouth daily after lunch.  09/03/15  Yes [provider]  loratadine (CLARITIN) 10 MG tablet Take 10 mg by mouth daily.    Yes [provider]  Multiple Vitamins-Minerals (MULTIVITAMIN PO) Take 1 tablet by mouth daily.    Yes [provider]  Omega-3 Fatty Acids (  FISH OIL) 1200 MG CAPS Take 1,200 mg by mouth daily.    Yes [provider]  omeprazole (PRILOSEC) 20 MG capsule Take 20 mg by mouth daily.  09/09/17  Yes [provider]  ONE TOUCH ULTRA TEST test strip TEST DAILY DX E11.21 10/28/17  Yes [provider]  polyethylene glycol powder (MIRALAX) 17 GM/SCOOP powder Take 1 Container by mouth daily as needed.   Yes [provider]  simvastatin (ZOCOR) 10 MG tablet Take 10 mg by mouth daily after  supper.  09/03/15  Yes [provider]  triamcinolone cream (KENALOG) 0.1 % SMARTSIG:Sparingly Topical Twice Daily 07/11/22  Yes [provider]  Wheat Dextrin (BENEFIBER) POWD Take 1 Scoop by mouth 2 (two) times daily.    Yes [provider]    Allergies as of 07/29/2022   (No Known Allergies)    No family history on file.  Social History   Socioeconomic History   Marital status: Married    Spouse name: Not on file   Number of children: Not on file   Years of education: Not on file   Highest education level: Not on file  Occupational History   Not on file  Tobacco Use   Smoking status: Never   Smokeless tobacco: Never  Vaping Use   Vaping Use: Never used  Substance and Sexual Activity   Alcohol use: No   Drug use: No   Sexual activity: Yes    Birth control/protection: None  Other Topics Concern   Not on file  Social History Narrative   Not on file   Social Determinants of Health   Financial Resource Strain: Not on file  Food Insecurity: Not on file  Transportation Needs: Not on file  Physical Activity: Not on file  Stress: Not on file  Social Connections: Not on file  Intimate Partner Violence: Not on file    Review of Systems: See HPI, otherwise negative ROS  Physical Exam: BP 119/78 (BP Location: Right Arm, Patient Position: Sitting, Cuff Size: Large)   Pulse 85   Temp 98 F (36.7 C) (Oral)   Ht 5' 10" (1.778 m)   Wt 212 lb (96.2 kg)   SpO2 96%   BMI 30.42 kg/m  General:   Alert,   pleasant and cooperative in NAD; accompanied by his spouse. Neck:  Supple; no masses or thyromegaly. No significant cervical adenopathy. Lungs:  Clear throughout to auscultation.   No wheezes, crackles, or rhonchi. No acute distress. Heart:  Regular rate and rhythm; no murmurs, clicks, rubs,  or gallops. Abdomen: Non-distended, normal bowel sounds.  Soft and nontender without appreciable mass or hepatosplenomegaly.  Pulses:  Normal pulses  noted. Extremities:  Without clubbing or edema.  Impression/Plan: Pleasant 76-year-old gentleman GERD well-controlled on omeprazole.  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  We tentatively plan to perform 1 more surveillance colonoscopy this year.  That is not reasonable.  I discussed pros and cons 1 more colonoscopy.  Patient would like to have 1 more surveillance colonoscopy.  Chronic constipation suboptimally managed takes MiraLAX sporadically.  He needs optimization of his regimen.  Recommendations:  As discussed, given your history of polyps and the fact that you remain in fairly good health, we should do 1 more surveillance colonoscopy.  We will schedule a surveillance colonoscopy (history of polyps) ASA 2 at the hospital in the near future.  Continue Benefiber daily  May continue prune juice daily.  Might consider taking kiwi it is very   effective here (scientifically proven).  1-2 kiwi fruit a day has only a fraction of the sugar as a glass of prune juice.  Also, recommend taking a dose of MiraLAX on a Monday and Thursday.  Hopefully this will keep you moving without causing loose stools or any other issues.   Notice: This dictation was prepared with Dragon dictation along with smaller phrase technology. Any transcriptional errors that result from this process are unintentional and may not be corrected upon review.  

## 2022-07-29 NOTE — Progress Notes (Unsigned)
Primary Care Physician:  Estanislado Pandy, MD Primary Gastroenterologist:  Dr. Jena Gauss  Pre-Procedure History & Physical: HPI:  Jacob Nash is a 77 y.o. male here for follow-up constipation GERD.  GERD doing well on omeprazole 20 mg daily takes it 30 minutes before lunch.  Still has issues with constipation takes MiraLAX sporadically Benefiber daily prune juice frequently as well.  Go 2 to 3 days without a bowel movement then takes a dose of MiraLAX.  If he takes MiraLAX daily he has issues with fecal seepage  Reportedly multiple colonic polyps removed in the distant past reportedly negative colonoscopy elsewhere 2014.  I performed colonoscopy 2019 found no polyps.  Jacob Nash continues to enjoy good health at 72.  He has very manageable comorbidities and remains very active.  Past Medical History:  Diagnosis Date   Anxiety    Arthritis    Cancer [facility]) MARCH 2017   PROSTATE    Diabetes mellitus without complication (HCC)    Elevated cholesterol    Frequent urination    GERD (gastroesophageal reflux disease)    Hemorrhoids    Hypertension    Joint pain    PONV (postoperative nausea and vomiting)    NAUSEA ONLY    Past Surgical History:  Procedure Laterality Date   BACK SURGERY  1995 AND 1996   LOWER BACK   CHOLECYSTECTOMY N/A 01/18/2020   Procedure: LAPAROSCOPIC CHOLECYSTECTOMY;  Surgeon: Franky Macho, MD;  Location: [facility];  Service: General;  Laterality: N/A;   COLONOSCOPY N/A 07/03/2017   Dr. Jena Gauss: Long redundant colon, diverticulosis, next colonoscopy in 5 years given personal history of colon polyps   ESOPHAGOGASTRODUODENOSCOPY N/A 07/03/2017   Dr. Jena Gauss: Small hiatal hernia   HERNIA REPAIR  03-21-2009    LEFT INGUINAL   INCISIONAL HERNIA REPAIR N/A 08/11/2016   Procedure: Sherald Hess HERNIORRHAPHY  WITH MESH;  Surgeon: Franky Macho, MD;  Location: [facility];  Service: General;  Laterality: N/A;   INGUINAL HERNIA REPAIR Right 10/21/2017   Procedure: HERNIA REPAIR INGUINAL  ADULT WITH MESH;  Surgeon: Franky Macho, MD;  Location: [facility];  Service: General;  Laterality: Right;   KNEE ARTHROSCOPY Right    LYMPHADENECTOMY Bilateral 10/11/2015   Procedure: BILATERAL PELVIC LYMPHADENECTOMY;  Surgeon: Heloise Purpura, MD;  Location: [facility];  Service: Urology;  Laterality: Bilateral;   PROSTATE BIOPSY  07-2015   PROSTATE SURGERY  09/2015   ROBOT ASSISTED LAPAROSCOPIC RADICAL PROSTATECTOMY N/A 10/11/2015   Procedure: XI ROBOTIC ASSISTED LAPAROSCOPIC RADICAL PROSTATECTOMY LEVEL 2;  Surgeon: Heloise Purpura, MD;  Location: [facility];  Service: Urology;  Laterality: N/A;   SHOULDER SURGERY Right 06-06-14   MUSCLE TEAR    Prior to Admission medications   Medication Sig Start Date End Date Taking? Authorizing Provider  ALPRAZolam Prudy Feeler) 0.5 MG tablet Take 0.5 mg by mouth 2 (two) times daily.  09/19/15  Yes [provider]  Cholecalciferol (VITAMIN D) 2000 units tablet Take 2,000 Units by mouth daily after lunch.   Yes [provider]  fluticasone (FLONASE) 50 MCG/ACT nasal spray Place 2 sprays into both nostrils daily. 09/12/15  Yes [provider]  lisinopril (PRINIVIL,ZESTRIL) 40 MG tablet Take 40 mg by mouth daily after lunch.  09/03/15  Yes [provider]  loratadine (CLARITIN) 10 MG tablet Take 10 mg by mouth daily.    Yes [provider]  Multiple Vitamins-Minerals (MULTIVITAMIN PO) Take 1 tablet by mouth daily.    Yes [provider]  Omega-3 Fatty Acids (  FISH OIL) 1200 MG CAPS Take 1,200 mg by mouth daily.    Yes [provider]  omeprazole (PRILOSEC) 20 MG capsule Take 20 mg by mouth daily.  09/09/17  Yes [provider]  ONE TOUCH ULTRA TEST test strip TEST DAILY DX E11.21 10/28/17  Yes [provider]  polyethylene glycol powder (MIRALAX) 17 GM/SCOOP powder Take 1 Container by mouth daily as needed.   Yes [provider]  simvastatin (ZOCOR) 10 MG tablet Take 10 mg by mouth daily after  supper.  09/03/15  Yes [provider]  triamcinolone cream (KENALOG) 0.1 % SMARTSIG:Sparingly Topical Twice Daily 07/11/22  Yes [provider]  Wheat Dextrin (BENEFIBER) POWD Take 1 Scoop by mouth 2 (two) times daily.    Yes [provider]    Allergies as of 07/29/2022   (No Known Allergies)    No family history on file.  Social History   Socioeconomic History   Marital status: Married    Spouse name: Not on file   Number of children: Not on file   Years of education: Not on file   Highest education level: Not on file  Occupational History   Not on file  Tobacco Use   Smoking status: Never   Smokeless tobacco: Never  Vaping Use   Vaping Use: Never used  Substance and Sexual Activity   Alcohol use: No   Drug use: No   Sexual activity: Yes    Birth control/protection: None  Other Topics Concern   Not on file  Social History Narrative   Not on file   Social Determinants of Health   Financial Resource Strain: Not on file  Food Insecurity: Not on file  Transportation Needs: Not on file  Physical Activity: Not on file  Stress: Not on file  Social Connections: Not on file  Intimate Partner Violence: Not on file    Review of Systems: See HPI, otherwise negative ROS  Physical Exam: BP 119/78 (BP Location: Right Arm, Patient Position: Sitting, Cuff Size: Large)   Pulse 85   Temp 98 F (36.7 C) (Oral)   Ht 5\' 10"  (1.778 m)   Wt 212 lb (96.2 kg)   SpO2 96%   BMI 30.42 kg/m  General:   Alert,   pleasant and cooperative in NAD; accompanied by his spouse. Neck:  Supple; no masses or thyromegaly. No significant cervical adenopathy. Lungs:  Clear throughout to auscultation.   No wheezes, crackles, or rhonchi. No acute distress. Heart:  Regular rate and rhythm; no murmurs, clicks, rubs,  or gallops. Abdomen: Non-distended, normal bowel sounds.  Soft and nontender without appreciable mass or hepatosplenomegaly.  Pulses:  Normal pulses  noted. Extremities:  Without clubbing or edema.  Impression/Plan: Pleasant 77 year old gentleman GERD well-controlled on omeprazole.  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  We tentatively plan to perform 1 more surveillance colonoscopy this year.  That is not reasonable.  I discussed pros and cons 1 more colonoscopy.  Patient would like to have 1 more surveillance colonoscopy.  Chronic constipation suboptimally managed takes MiraLAX sporadically.  He needs optimization of his regimen.  Recommendations:  As discussed, given your history of polyps and the fact that you remain in fairly good health, we should do 1 more surveillance colonoscopy.  We will schedule a surveillance colonoscopy (history of polyps) ASA 2 at the hospital in the near future.  Continue Benefiber daily  May continue prune juice daily.  Might consider taking kiwi it is very  effective here (scientifically proven).  1-2 kiwi fruit a day has only a fraction of the sugar as a glass of prune juice.  Also, recommend taking a dose of MiraLAX on a Monday and Thursday.  Hopefully this will keep you moving without causing loose stools or any other issues.   Notice: This dictation was prepared with Dragon dictation along with smaller phrase technology. Any transcriptional errors that result from this process are unintentional and may not be corrected upon review.

## 2022-07-30 ENCOUNTER — Other Ambulatory Visit: Payer: Self-pay | Admitting: *Deleted

## 2022-07-30 ENCOUNTER — Encounter: Payer: Self-pay | Admitting: *Deleted

## 2022-07-30 MED ORDER — NA SULFATE-K SULFATE-MG SULF 17.5-3.13-1.6 GM/177ML PO SOLN: ORAL | 0 refills | Status: AC

## 2022-07-31 DIAGNOSIS — M5441 Lumbago with sciatica, right side: Secondary | ICD-10-CM | POA: Diagnosis not present

## 2022-07-31 DIAGNOSIS — M25551 Pain in right hip: Secondary | ICD-10-CM | POA: Diagnosis not present

## 2022-08-11 ENCOUNTER — Telehealth: Payer: Self-pay | Admitting: *Deleted

## 2022-08-11 NOTE — Telephone Encounter (Signed)
Pt's wife Juliann Pulse, called and left vm stating pt was going to have to pay a co-pay for the upcoming colonoscopy. She wanted to know why he was having to pay that.  [place]

## 2022-08-18 ENCOUNTER — Ambulatory Visit (HOSPITAL_COMMUNITY)
Admission: RE | Admit: 2022-08-18 | Discharge: 2022-08-18 | Disposition: A | Discharge status: Home / Self Care | Payer: Medicare HMO | Attending: Internal Medicine | Admitting: Internal Medicine

## 2022-08-18 ENCOUNTER — Ambulatory Visit (HOSPITAL_COMMUNITY): Payer: Medicare HMO | Admitting: Anesthesiology

## 2022-08-18 ENCOUNTER — Ambulatory Visit (HOSPITAL_BASED_OUTPATIENT_CLINIC_OR_DEPARTMENT_OTHER): Payer: Medicare HMO | Admitting: Anesthesiology

## 2022-08-18 ENCOUNTER — Encounter (HOSPITAL_COMMUNITY): Payer: Self-pay | Admitting: Internal Medicine

## 2022-08-18 ENCOUNTER — Encounter (HOSPITAL_COMMUNITY)
Admission: RE | Disposition: A | Discharge status: Home / Self Care | Payer: Self-pay | Source: Home / Self Care | Attending: Internal Medicine

## 2022-08-18 ENCOUNTER — Other Ambulatory Visit: Payer: Self-pay

## 2022-08-18 DIAGNOSIS — K219 Gastro-esophageal reflux disease without esophagitis: Secondary | ICD-10-CM | POA: Diagnosis not present

## 2022-08-18 DIAGNOSIS — K649 Unspecified hemorrhoids: Secondary | ICD-10-CM

## 2022-08-18 DIAGNOSIS — K635 Polyp of colon: Secondary | ICD-10-CM | POA: Diagnosis not present

## 2022-08-18 DIAGNOSIS — Z86010 Personal history of colonic polyps: Secondary | ICD-10-CM | POA: Diagnosis not present

## 2022-08-18 DIAGNOSIS — Z8546 Personal history of malignant neoplasm of prostate: Secondary | ICD-10-CM | POA: Diagnosis not present

## 2022-08-18 DIAGNOSIS — Z79899 Other long term (current) drug therapy: Secondary | ICD-10-CM | POA: Insufficient documentation

## 2022-08-18 DIAGNOSIS — K5730 Diverticulosis of large intestine without perforation or abscess without bleeding: Secondary | ICD-10-CM | POA: Diagnosis not present

## 2022-08-18 DIAGNOSIS — K5790 Diverticulosis of intestine, part unspecified, without perforation or abscess without bleeding: Secondary | ICD-10-CM | POA: Diagnosis not present

## 2022-08-18 DIAGNOSIS — Z1211 Encounter for screening for malignant neoplasm of colon: Secondary | ICD-10-CM | POA: Diagnosis not present

## 2022-08-18 DIAGNOSIS — I10 Essential (primary) hypertension: Secondary | ICD-10-CM | POA: Diagnosis not present

## 2022-08-18 DIAGNOSIS — K640 First degree hemorrhoids: Secondary | ICD-10-CM | POA: Insufficient documentation

## 2022-08-18 DIAGNOSIS — E7800 Pure hypercholesterolemia, unspecified: Secondary | ICD-10-CM | POA: Diagnosis not present

## 2022-08-18 DIAGNOSIS — D122 Benign neoplasm of ascending colon: Secondary | ICD-10-CM | POA: Insufficient documentation

## 2022-08-18 DIAGNOSIS — E119 Type 2 diabetes mellitus without complications: Secondary | ICD-10-CM | POA: Insufficient documentation

## 2022-08-18 DIAGNOSIS — R69 Illness, unspecified: Secondary | ICD-10-CM | POA: Diagnosis not present

## 2022-08-18 DIAGNOSIS — F419 Anxiety disorder, unspecified: Secondary | ICD-10-CM | POA: Diagnosis not present

## 2022-08-18 HISTORY — PX: COLONOSCOPY WITH PROPOFOL: SHX5780

## 2022-08-18 HISTORY — PX: POLYPECTOMY: SHX5525

## 2022-08-18 LAB — GLUCOSE, CAPILLARY: Glucose-Capillary: 118 mg/dL — ABNORMAL HIGH (ref 70–99)

## 2022-08-18 SURGERY — COLONOSCOPY WITH PROPOFOL
Anesthesia: General

## 2022-08-18 MED ORDER — PROPOFOL 500 MG/50ML IV EMUL
INTRAVENOUS | Status: DC | PRN
  Administered 2022-08-18: 200 ug/kg/min via INTRAVENOUS

## 2022-08-18 MED ORDER — LIDOCAINE HCL (CARDIAC) PF 100 MG/5ML IV SOSY
PREFILLED_SYRINGE | INTRAVENOUS | Status: DC | PRN
  Administered 2022-08-18: 50 mg via INTRATRACHEAL

## 2022-08-18 MED ORDER — LACTATED RINGERS IV SOLN: INTRAVENOUS | Status: DC

## 2022-08-18 MED ORDER — PHENYLEPHRINE 80 MCG/ML (10ML) SYRINGE FOR IV PUSH (FOR BLOOD PRESSURE SUPPORT)
PREFILLED_SYRINGE | INTRAVENOUS | Status: DC | PRN
  Administered 2022-08-18 (×3): 160 ug via INTRAVENOUS

## 2022-08-18 MED ORDER — PROPOFOL 10 MG/ML IV BOLUS
INTRAVENOUS | Status: DC | PRN
  Administered 2022-08-18: 90 mg via INTRAVENOUS

## 2022-08-18 NOTE — Discharge Instructions (Addendum)
  Colonoscopy Discharge Instructions  Read the instructions outlined below and refer to this sheet in the next few weeks. These discharge instructions provide you with general information on caring for yourself after you leave the hospital. Your doctor may also give you specific instructions. While your treatment has been planned according to the most current medical practices available, unavoidable complications occasionally occur. If you have any problems or questions after discharge, call Dr. Gala Romney at [phone]. ACTIVITY You may resume your regular activity, but move at a slower pace for the next 24 hours.  Take frequent rest periods for the next 24 hours.  Walking will help get rid of the air and reduce the bloated feeling in your belly (abdomen).  No driving for 24 hours (because of the medicine (anesthesia) used during the test).   Do not sign any important legal documents or operate any machinery for 24 hours (because of the anesthesia used during the test).  NUTRITION Drink plenty of fluids.  You may resume your normal diet as instructed by your doctor.  Begin with a light meal and progress to your normal diet. Heavy or fried foods are harder to digest and may make you feel sick to your stomach (nauseated).  Avoid alcoholic beverages for 24 hours or as instructed.  MEDICATIONS You may resume your normal medications unless your doctor tells you otherwise.  WHAT YOU CAN EXPECT TODAY Some feelings of bloating in the abdomen.  Passage of more gas than usual.  Spotting of blood in your stool or on the toilet paper.  IF YOU HAD POLYPS REMOVED DURING THE COLONOSCOPY: No aspirin products for 7 days or as instructed.  No alcohol for 7 days or as instructed.  Eat a soft diet for the next 24 hours.  FINDING OUT THE RESULTS OF YOUR TEST Not all test results are available during your visit. If your test results are not back during the visit, make an appointment with your caregiver to find out the  results. Do not assume everything is normal if you have not heard from your caregiver or the medical facility. It is important for you to follow up on all of your test results.  SEEK IMMEDIATE MEDICAL ATTENTION IF: You have more than a spotting of blood in your stool.  Your belly is swollen (abdominal distention).  You are nauseated or vomiting.  You have a temperature over 101.  You have abdominal pain or discomfort that is severe or gets worse throughout the day.    1 polyp removed from your colon.  Diverticulosis and polyp information provided  Further recommendations to follow pending review of pathology report  At patient request, I called Oren Binet at [phone] findings and recommendations

## 2022-08-18 NOTE — Interval H&P Note (Signed)
History and Physical Interval Note:  08/18/2022 7:26 AM  Jacob Nash  has presented today for surgery, with the diagnosis of history of polyps.  The various methods of treatment have been discussed with the patient and family. After consideration of risks, benefits and other options for treatment, the patient has consented to  Procedure(s) with comments: COLONOSCOPY WITH PROPOFOL (N/A) - 7:30 am as a surgical intervention.  The patient's history has been reviewed, patient examined, no change in status, stable for surgery.  I have reviewed the patient's chart and labs.  Questions were answered to the patient's satisfaction.     Jacob Nash  No change.  Surveillance colonoscopy per plan.  The risks, benefits, limitations, alternatives and imponderables have been reviewed with the patient. Questions have been answered. All parties are agreeable.

## 2022-08-18 NOTE — Transfer of Care (Signed)
Immediate Anesthesia Transfer of Care Note  Patient: Jacob Nash  Procedure(s) Performed: COLONOSCOPY WITH PROPOFOL POLYPECTOMY  Patient Location: Endoscopy Unit  Anesthesia Type:General  Level of Consciousness: awake, alert , oriented, and patient cooperative  Airway & Oxygen Therapy: Patient Spontanous Breathing  Post-op Assessment: Report given to RN, Post -op Vital signs reviewed and stable, and Patient moving all extremities  Post vital signs: Reviewed and stable  Last Vitals:  Vitals Value Taken Time  BP 86/47 08/18/22 0800  Temp 36.6 C 08/18/22 0800  Pulse 71 08/18/22 0800  Resp 19 08/18/22 0800  SpO2 93 % 08/18/22 0800    Last Pain:  Vitals:   08/18/22 0800  TempSrc: Oral  PainSc: 0-No pain      Patients Stated Pain Goal: 6 ([id number])  Complications: No notable events documented.

## 2022-08-18 NOTE — Op Note (Signed)
[facility] Patient Name: Jacob Nash Procedure Date: 08/18/2022 7:19 AM MRN: [id number] Date of Birth: June 05, 1945 Attending MD: Norvel Richards , MD, [id number] CSN: [id number] Age: 77 Admit Type: Outpatient Procedure:                Colonoscopy Indications:              High risk colon cancer surveillance: Personal                            history of colonic polyps Providers:                Norvel Richards, MD, Lurline Del, RN, Dereck Leep, Technician Referring MD:              Medicines:                Propofol per Anesthesia Complications:            No immediate complications. Estimated Blood Loss:     Estimated blood loss was minimal. Procedure:                Pre-Anesthesia Assessment:                           - Prior to the procedure, a History and Physical                            was performed, and patient medications and                            allergies were reviewed. The patient's tolerance of                            previous anesthesia was also reviewed. The risks                            and benefits of the procedure and the sedation                            options and risks were discussed with the patient.                            All questions were answered, and informed consent                            was obtained. ASA Grade Assessment: II - A patient                            with mild systemic disease. After reviewing the                            risks and benefits, the patient was deemed in  satisfactory condition to undergo the procedure.                           After obtaining informed consent, the colonoscope                            was passed under direct vision. Throughout the                            procedure, the patient's blood pressure, pulse, and                            oxygen saturations were monitored continuously. The                             [id number]) scope was introduced through the                            anus and advanced to the the cecum, identified by                            appendiceal orifice and ileocecal valve. The                            colonoscopy was performed without difficulty. The                            patient tolerated the procedure well. The quality                            of the bowel preparation was adequate. The                            ileocecal valve, appendiceal orifice, and rectum                            were photographed. Scope In: 7:38:57 AM Scope Out: 7:51:55 AM Scope Withdrawal Time: 0 hours 6 minutes 37 seconds  Total Procedure Duration: 0 hours 12 minutes 58 seconds  Findings:      The perianal and digital rectal examinations were normal.      Many large-mouthed diverticula were found in the sigmoid colon and       descending colon.      A 5 mm polyp was found in the ascending colon. The polyp was sessile.       The polyp was removed with a cold snare. Resection and retrieval were       complete.      Non-bleeding internal hemorrhoids were found during retroflexion. The       hemorrhoids were moderate, medium-sized and Grade I (internal       hemorrhoids that do not prolapse). Impression:               - Diverticulosis in the sigmoid colon and in the  descending colon.                           - One 5 mm polyp in the ascending colon, removed                            with a cold snare. Resected and retrieved.                           - Non-bleeding internal hemorrhoids. Moderate Sedation:      Moderate (conscious) sedation was personally administered by an       anesthesia professional. The following parameters were monitored: oxygen       saturation, heart rate, blood pressure, respiratory rate, EKG, adequacy       of pulmonary ventilation, and response to care. Recommendation:           - Repeat colonoscopy date to be  determined after                            pending pathology results are reviewed for                            surveillance.                           - Return to GI office after studies are complete. Procedure Code(s):        --- Professional ---                           [phone], Colonoscopy, flexible; with removal of                            tumor(s), polyp(s), or other lesion(s) by snare                            technique Diagnosis Code(s):        --- Professional ---                           Z86.010, Personal history of colonic polyps                           K64.0, First degree hemorrhoids                           D12.2, Benign neoplasm of ascending colon                           K57.30, Diverticulosis of large intestine without                            perforation or abscess without bleeding CPT copyright 2022 [employer]. All rights reserved. The codes documented in this report are preliminary and upon coder review may  be revised to meet current compliance requirements. Jacob Nash. Jacob Stueve, MD Norvel Richards, MD 08/18/2022 8:05:38 AM This report has been signed electronically. Number of  Addenda: 0

## 2022-08-18 NOTE — Anesthesia Preprocedure Evaluation (Signed)
Anesthesia Evaluation  Patient identified by MRN, date of birth, ID band Patient awake    Reviewed: Allergy & Precautions, H&P , NPO status , Patient's Chart, lab work & pertinent test results, reviewed documented beta blocker date and time   History of Anesthesia Complications (+) PONV and history of anesthetic complications  Airway Mallampati: II  TM Distance: >3 FB Neck ROM: full    Dental no notable dental hx.    Pulmonary neg pulmonary ROS   Pulmonary exam normal breath sounds clear to auscultation       Cardiovascular Exercise Tolerance: Good hypertension, negative cardio ROS  Rhythm:regular Rate:Normal     Neuro/Psych   Anxiety     negative neurological ROS  negative psych ROS   GI/Hepatic negative GI ROS, Neg liver ROS,GERD  ,,  Endo/Other  negative endocrine ROSdiabetes    Renal/GU Renal diseasenegative Renal ROS  negative genitourinary   Musculoskeletal   Abdominal   Peds  Hematology negative hematology ROS (+)   Anesthesia Other Findings   Reproductive/Obstetrics negative OB ROS                             Anesthesia Physical Anesthesia Plan  ASA: 2  Anesthesia Plan: General   Post-op Pain Management:    Induction:   PONV Risk Score and Plan: Propofol infusion  Airway Management Planned:   Additional Equipment:   Intra-op Plan:   Post-operative Plan:   Informed Consent: I have reviewed the patients History and Physical, chart, labs and discussed the procedure including the risks, benefits and alternatives for the proposed anesthesia with the patient or authorized representative who has indicated his/her understanding and acceptance.     Dental Advisory Given  Plan Discussed with: CRNA  Anesthesia Plan Comments:        Anesthesia Quick Evaluation

## 2022-08-19 LAB — SURGICAL PATHOLOGY

## 2022-08-20 ENCOUNTER — Encounter: Payer: Self-pay | Admitting: Internal Medicine

## 2022-08-22 NOTE — Anesthesia Postprocedure Evaluation (Signed)
Anesthesia Post Note  Patient: Jacob Nash  Procedure(s) Performed: COLONOSCOPY WITH PROPOFOL POLYPECTOMY  Patient location during evaluation: Phase II Anesthesia Type: General Level of consciousness: awake Pain management: pain level controlled Vital Signs Assessment: post-procedure vital signs reviewed and stable Respiratory status: spontaneous breathing and respiratory function stable Cardiovascular status: blood pressure returned to baseline and stable Postop Assessment: no headache and no apparent nausea or vomiting Anesthetic complications: no Comments: Late entry   No notable events documented.   Last Vitals:  Vitals:   08/18/22 0800 08/18/22 0805  BP: (!) 86/47 95/66  Pulse: 71 85  Resp: 19 18  Temp: 36.6 C   SpO2: 93% 96%    Last Pain:  Vitals:   08/18/22 0800  TempSrc: Oral  PainSc: 0-No pain                 Windell Norfolk

## 2022-08-25 ENCOUNTER — Encounter (HOSPITAL_COMMUNITY): Payer: Self-pay | Admitting: Internal Medicine

## 2022-08-29 DIAGNOSIS — D49511 Neoplasm of unspecified behavior of right kidney: Secondary | ICD-10-CM | POA: Diagnosis not present

## 2022-08-29 DIAGNOSIS — C61 Malignant neoplasm of prostate: Secondary | ICD-10-CM | POA: Diagnosis not present

## 2022-09-02 DIAGNOSIS — K760 Fatty (change of) liver, not elsewhere classified: Secondary | ICD-10-CM | POA: Diagnosis not present

## 2022-09-02 DIAGNOSIS — D49511 Neoplasm of unspecified behavior of right kidney: Secondary | ICD-10-CM | POA: Diagnosis not present

## 2022-09-02 DIAGNOSIS — C641 Malignant neoplasm of right kidney, except renal pelvis: Secondary | ICD-10-CM | POA: Diagnosis not present

## 2022-09-05 DIAGNOSIS — D49511 Neoplasm of unspecified behavior of right kidney: Secondary | ICD-10-CM | POA: Diagnosis not present

## 2022-09-05 DIAGNOSIS — C61 Malignant neoplasm of prostate: Secondary | ICD-10-CM | POA: Diagnosis not present

## 2022-10-15 DIAGNOSIS — S29019A Strain of muscle and tendon of unspecified wall of thorax, initial encounter: Secondary | ICD-10-CM | POA: Diagnosis not present

## 2022-10-15 DIAGNOSIS — S161XXA Strain of muscle, fascia and tendon at neck level, initial encounter: Secondary | ICD-10-CM | POA: Diagnosis not present

## 2022-10-15 DIAGNOSIS — R030 Elevated blood-pressure reading, without diagnosis of hypertension: Secondary | ICD-10-CM | POA: Diagnosis not present

## 2022-10-15 DIAGNOSIS — Z6828 Body mass index (BMI) 28.0-28.9, adult: Secondary | ICD-10-CM | POA: Diagnosis not present

## 2022-11-12 DIAGNOSIS — L309 Dermatitis, unspecified: Secondary | ICD-10-CM | POA: Diagnosis not present

## 2022-11-12 DIAGNOSIS — L570 Actinic keratosis: Secondary | ICD-10-CM | POA: Diagnosis not present

## 2022-11-12 DIAGNOSIS — D485 Neoplasm of uncertain behavior of skin: Secondary | ICD-10-CM | POA: Diagnosis not present

## 2022-12-03 DIAGNOSIS — E1121 Type 2 diabetes mellitus with diabetic nephropathy: Secondary | ICD-10-CM | POA: Diagnosis not present

## 2022-12-03 DIAGNOSIS — I10 Essential (primary) hypertension: Secondary | ICD-10-CM | POA: Diagnosis not present

## 2022-12-03 DIAGNOSIS — E782 Mixed hyperlipidemia: Secondary | ICD-10-CM | POA: Diagnosis not present

## 2022-12-03 DIAGNOSIS — E7849 Other hyperlipidemia: Secondary | ICD-10-CM | POA: Diagnosis not present

## 2022-12-10 DIAGNOSIS — Z8546 Personal history of malignant neoplasm of prostate: Secondary | ICD-10-CM | POA: Diagnosis not present

## 2022-12-10 DIAGNOSIS — M545 Low back pain, unspecified: Secondary | ICD-10-CM | POA: Diagnosis not present

## 2022-12-10 DIAGNOSIS — K2100 Gastro-esophageal reflux disease with esophagitis, without bleeding: Secondary | ICD-10-CM | POA: Diagnosis not present

## 2022-12-10 DIAGNOSIS — E1121 Type 2 diabetes mellitus with diabetic nephropathy: Secondary | ICD-10-CM | POA: Diagnosis not present

## 2022-12-10 DIAGNOSIS — F411 Generalized anxiety disorder: Secondary | ICD-10-CM | POA: Diagnosis not present

## 2022-12-10 DIAGNOSIS — I10 Essential (primary) hypertension: Secondary | ICD-10-CM | POA: Diagnosis not present

## 2022-12-10 DIAGNOSIS — J3089 Other allergic rhinitis: Secondary | ICD-10-CM | POA: Diagnosis not present

## 2022-12-10 DIAGNOSIS — E7849 Other hyperlipidemia: Secondary | ICD-10-CM | POA: Diagnosis not present

## 2022-12-10 DIAGNOSIS — K5909 Other constipation: Secondary | ICD-10-CM | POA: Diagnosis not present

## 2022-12-18 DIAGNOSIS — I10 Essential (primary) hypertension: Secondary | ICD-10-CM | POA: Diagnosis not present

## 2022-12-18 DIAGNOSIS — M1990 Unspecified osteoarthritis, unspecified site: Secondary | ICD-10-CM | POA: Diagnosis not present

## 2022-12-18 DIAGNOSIS — E669 Obesity, unspecified: Secondary | ICD-10-CM | POA: Diagnosis not present

## 2022-12-18 DIAGNOSIS — M545 Low back pain, unspecified: Secondary | ICD-10-CM | POA: Diagnosis not present

## 2022-12-18 DIAGNOSIS — Z79899 Other long term (current) drug therapy: Secondary | ICD-10-CM | POA: Diagnosis not present

## 2022-12-18 DIAGNOSIS — E1136 Type 2 diabetes mellitus with diabetic cataract: Secondary | ICD-10-CM | POA: Diagnosis not present

## 2022-12-18 DIAGNOSIS — K219 Gastro-esophageal reflux disease without esophagitis: Secondary | ICD-10-CM | POA: Diagnosis not present

## 2022-12-18 DIAGNOSIS — N529 Male erectile dysfunction, unspecified: Secondary | ICD-10-CM | POA: Diagnosis not present

## 2022-12-18 DIAGNOSIS — E785 Hyperlipidemia, unspecified: Secondary | ICD-10-CM | POA: Diagnosis not present

## 2022-12-18 DIAGNOSIS — Z809 Family history of malignant neoplasm, unspecified: Secondary | ICD-10-CM | POA: Diagnosis not present

## 2022-12-18 DIAGNOSIS — Z008 Encounter for other general examination: Secondary | ICD-10-CM | POA: Diagnosis not present

## 2022-12-18 DIAGNOSIS — F419 Anxiety disorder, unspecified: Secondary | ICD-10-CM | POA: Diagnosis not present

## 2022-12-18 DIAGNOSIS — J309 Allergic rhinitis, unspecified: Secondary | ICD-10-CM | POA: Diagnosis not present

## 2023-02-05 DIAGNOSIS — Z6828 Body mass index (BMI) 28.0-28.9, adult: Secondary | ICD-10-CM | POA: Diagnosis not present

## 2023-02-05 DIAGNOSIS — R030 Elevated blood-pressure reading, without diagnosis of hypertension: Secondary | ICD-10-CM | POA: Diagnosis not present

## 2023-02-05 DIAGNOSIS — J309 Allergic rhinitis, unspecified: Secondary | ICD-10-CM | POA: Diagnosis not present

## 2023-02-05 DIAGNOSIS — R059 Cough, unspecified: Secondary | ICD-10-CM | POA: Diagnosis not present

## 2023-02-24 DIAGNOSIS — Z23 Encounter for immunization: Secondary | ICD-10-CM | POA: Diagnosis not present

## 2023-03-10 DIAGNOSIS — H903 Sensorineural hearing loss, bilateral: Secondary | ICD-10-CM | POA: Diagnosis not present

## 2023-03-13 DIAGNOSIS — J019 Acute sinusitis, unspecified: Secondary | ICD-10-CM | POA: Diagnosis not present

## 2023-03-13 DIAGNOSIS — Z6828 Body mass index (BMI) 28.0-28.9, adult: Secondary | ICD-10-CM | POA: Diagnosis not present

## 2023-05-02 DIAGNOSIS — M545 Low back pain, unspecified: Secondary | ICD-10-CM | POA: Diagnosis not present

## 2023-05-02 DIAGNOSIS — M791 Myalgia, unspecified site: Secondary | ICD-10-CM | POA: Diagnosis not present

## 2023-05-06 DIAGNOSIS — H2513 Age-related nuclear cataract, bilateral: Secondary | ICD-10-CM | POA: Diagnosis not present

## 2023-05-06 DIAGNOSIS — E119 Type 2 diabetes mellitus without complications: Secondary | ICD-10-CM | POA: Diagnosis not present

## 2023-05-06 DIAGNOSIS — H02834 Dermatochalasis of left upper eyelid: Secondary | ICD-10-CM | POA: Diagnosis not present

## 2023-05-06 DIAGNOSIS — H02831 Dermatochalasis of right upper eyelid: Secondary | ICD-10-CM | POA: Diagnosis not present

## 2023-06-05 DIAGNOSIS — E7849 Other hyperlipidemia: Secondary | ICD-10-CM | POA: Diagnosis not present

## 2023-06-05 DIAGNOSIS — K219 Gastro-esophageal reflux disease without esophagitis: Secondary | ICD-10-CM | POA: Diagnosis not present

## 2023-06-05 DIAGNOSIS — I10 Essential (primary) hypertension: Secondary | ICD-10-CM | POA: Diagnosis not present

## 2023-06-05 DIAGNOSIS — E782 Mixed hyperlipidemia: Secondary | ICD-10-CM | POA: Diagnosis not present

## 2023-06-05 DIAGNOSIS — E119 Type 2 diabetes mellitus without complications: Secondary | ICD-10-CM | POA: Diagnosis not present

## 2023-06-09 DIAGNOSIS — M7918 Myalgia, other site: Secondary | ICD-10-CM | POA: Diagnosis not present

## 2023-06-09 DIAGNOSIS — M4722 Other spondylosis with radiculopathy, cervical region: Secondary | ICD-10-CM | POA: Diagnosis not present

## 2023-06-12 DIAGNOSIS — M545 Low back pain, unspecified: Secondary | ICD-10-CM | POA: Diagnosis not present

## 2023-06-12 DIAGNOSIS — R7303 Prediabetes: Secondary | ICD-10-CM | POA: Diagnosis not present

## 2023-06-12 DIAGNOSIS — E782 Mixed hyperlipidemia: Secondary | ICD-10-CM | POA: Diagnosis not present

## 2023-06-12 DIAGNOSIS — Z6828 Body mass index (BMI) 28.0-28.9, adult: Secondary | ICD-10-CM | POA: Diagnosis not present

## 2023-06-12 DIAGNOSIS — F419 Anxiety disorder, unspecified: Secondary | ICD-10-CM | POA: Diagnosis not present

## 2023-06-23 DIAGNOSIS — Z0001 Encounter for general adult medical examination with abnormal findings: Secondary | ICD-10-CM | POA: Diagnosis not present

## 2023-06-23 DIAGNOSIS — Z6828 Body mass index (BMI) 28.0-28.9, adult: Secondary | ICD-10-CM | POA: Diagnosis not present

## 2023-07-14 DIAGNOSIS — K121 Other forms of stomatitis: Secondary | ICD-10-CM | POA: Diagnosis not present

## 2023-07-14 DIAGNOSIS — Z6829 Body mass index (BMI) 29.0-29.9, adult: Secondary | ICD-10-CM | POA: Diagnosis not present

## 2023-08-13 DIAGNOSIS — T466X5A Adverse effect of antihyperlipidemic and antiarteriosclerotic drugs, initial encounter: Secondary | ICD-10-CM | POA: Diagnosis not present

## 2023-08-13 DIAGNOSIS — E782 Mixed hyperlipidemia: Secondary | ICD-10-CM | POA: Diagnosis not present

## 2023-08-13 DIAGNOSIS — Z6828 Body mass index (BMI) 28.0-28.9, adult: Secondary | ICD-10-CM | POA: Diagnosis not present

## 2023-08-13 DIAGNOSIS — M791 Myalgia, unspecified site: Secondary | ICD-10-CM | POA: Diagnosis not present

## 2023-08-13 DIAGNOSIS — L209 Atopic dermatitis, unspecified: Secondary | ICD-10-CM | POA: Diagnosis not present

## 2023-08-13 DIAGNOSIS — E7849 Other hyperlipidemia: Secondary | ICD-10-CM | POA: Diagnosis not present

## 2023-08-31 DIAGNOSIS — C61 Malignant neoplasm of prostate: Secondary | ICD-10-CM | POA: Diagnosis not present

## 2023-08-31 DIAGNOSIS — D49511 Neoplasm of unspecified behavior of right kidney: Secondary | ICD-10-CM | POA: Diagnosis not present

## 2023-09-09 DIAGNOSIS — D49511 Neoplasm of unspecified behavior of right kidney: Secondary | ICD-10-CM | POA: Diagnosis not present

## 2023-09-09 DIAGNOSIS — C61 Malignant neoplasm of prostate: Secondary | ICD-10-CM | POA: Diagnosis not present

## 2023-11-09 DIAGNOSIS — M545 Low back pain, unspecified: Secondary | ICD-10-CM | POA: Diagnosis not present

## 2023-11-09 DIAGNOSIS — M25511 Pain in right shoulder: Secondary | ICD-10-CM | POA: Diagnosis not present

## 2023-11-09 DIAGNOSIS — M542 Cervicalgia: Secondary | ICD-10-CM | POA: Diagnosis not present

## 2023-11-09 DIAGNOSIS — Z6828 Body mass index (BMI) 28.0-28.9, adult: Secondary | ICD-10-CM | POA: Diagnosis not present

## 2023-11-11 DIAGNOSIS — L089 Local infection of the skin and subcutaneous tissue, unspecified: Secondary | ICD-10-CM | POA: Diagnosis not present

## 2023-11-11 DIAGNOSIS — L986 Other infiltrative disorders of the skin and subcutaneous tissue: Secondary | ICD-10-CM | POA: Diagnosis not present

## 2023-11-11 DIAGNOSIS — D485 Neoplasm of uncertain behavior of skin: Secondary | ICD-10-CM | POA: Diagnosis not present

## 2023-11-11 DIAGNOSIS — L570 Actinic keratosis: Secondary | ICD-10-CM | POA: Diagnosis not present

## 2023-11-11 DIAGNOSIS — L309 Dermatitis, unspecified: Secondary | ICD-10-CM | POA: Diagnosis not present

## 2023-11-11 DIAGNOSIS — B353 Tinea pedis: Secondary | ICD-10-CM | POA: Diagnosis not present

## 2023-11-11 DIAGNOSIS — B351 Tinea unguium: Secondary | ICD-10-CM | POA: Diagnosis not present

## 2023-12-11 DIAGNOSIS — I10 Essential (primary) hypertension: Secondary | ICD-10-CM | POA: Diagnosis not present

## 2023-12-11 DIAGNOSIS — E7849 Other hyperlipidemia: Secondary | ICD-10-CM | POA: Diagnosis not present

## 2023-12-11 DIAGNOSIS — E119 Type 2 diabetes mellitus without complications: Secondary | ICD-10-CM | POA: Diagnosis not present

## 2023-12-11 DIAGNOSIS — R7303 Prediabetes: Secondary | ICD-10-CM | POA: Diagnosis not present

## 2023-12-14 DIAGNOSIS — Z6828 Body mass index (BMI) 28.0-28.9, adult: Secondary | ICD-10-CM | POA: Diagnosis not present

## 2023-12-14 DIAGNOSIS — S80262A Insect bite (nonvenomous), left knee, initial encounter: Secondary | ICD-10-CM | POA: Diagnosis not present

## 2023-12-21 DIAGNOSIS — Z6828 Body mass index (BMI) 28.0-28.9, adult: Secondary | ICD-10-CM | POA: Diagnosis not present

## 2023-12-21 DIAGNOSIS — E7849 Other hyperlipidemia: Secondary | ICD-10-CM | POA: Diagnosis not present

## 2023-12-21 DIAGNOSIS — I10 Essential (primary) hypertension: Secondary | ICD-10-CM | POA: Diagnosis not present

## 2023-12-21 DIAGNOSIS — K219 Gastro-esophageal reflux disease without esophagitis: Secondary | ICD-10-CM | POA: Diagnosis not present

## 2023-12-21 DIAGNOSIS — E782 Mixed hyperlipidemia: Secondary | ICD-10-CM | POA: Diagnosis not present

## 2023-12-21 DIAGNOSIS — R7303 Prediabetes: Secondary | ICD-10-CM | POA: Diagnosis not present

## 2024-01-15 DIAGNOSIS — R030 Elevated blood-pressure reading, without diagnosis of hypertension: Secondary | ICD-10-CM | POA: Diagnosis not present

## 2024-01-15 DIAGNOSIS — R0981 Nasal congestion: Secondary | ICD-10-CM | POA: Diagnosis not present

## 2024-01-15 DIAGNOSIS — U071 COVID-19: Secondary | ICD-10-CM | POA: Diagnosis not present

## 2024-01-15 DIAGNOSIS — E669 Obesity, unspecified: Secondary | ICD-10-CM | POA: Diagnosis not present

## 2024-01-15 DIAGNOSIS — Z6830 Body mass index (BMI) 30.0-30.9, adult: Secondary | ICD-10-CM | POA: Diagnosis not present

## 2024-02-05 DIAGNOSIS — J0100 Acute maxillary sinusitis, unspecified: Secondary | ICD-10-CM | POA: Diagnosis not present

## 2024-02-05 DIAGNOSIS — Z6828 Body mass index (BMI) 28.0-28.9, adult: Secondary | ICD-10-CM | POA: Diagnosis not present

## 2024-02-23 DIAGNOSIS — Z6828 Body mass index (BMI) 28.0-28.9, adult: Secondary | ICD-10-CM | POA: Diagnosis not present

## 2024-02-23 DIAGNOSIS — J0100 Acute maxillary sinusitis, unspecified: Secondary | ICD-10-CM | POA: Diagnosis not present

## 2024-02-23 DIAGNOSIS — Z20828 Contact with and (suspected) exposure to other viral communicable diseases: Secondary | ICD-10-CM | POA: Diagnosis not present

## 2024-03-10 DIAGNOSIS — Z6829 Body mass index (BMI) 29.0-29.9, adult: Secondary | ICD-10-CM | POA: Diagnosis not present

## 2024-03-10 DIAGNOSIS — J0100 Acute maxillary sinusitis, unspecified: Secondary | ICD-10-CM | POA: Diagnosis not present

## 2024-03-11 DIAGNOSIS — J0100 Acute maxillary sinusitis, unspecified: Secondary | ICD-10-CM | POA: Diagnosis not present

## 2024-03-11 DIAGNOSIS — J342 Deviated nasal septum: Secondary | ICD-10-CM | POA: Diagnosis not present

## 2024-03-11 DIAGNOSIS — J320 Chronic maxillary sinusitis: Secondary | ICD-10-CM | POA: Diagnosis not present

## 2024-03-17 DIAGNOSIS — R448 Other symptoms and signs involving general sensations and perceptions: Secondary | ICD-10-CM | POA: Diagnosis not present

## 2024-03-17 DIAGNOSIS — R0981 Nasal congestion: Secondary | ICD-10-CM | POA: Diagnosis not present

## 2024-03-17 DIAGNOSIS — H903 Sensorineural hearing loss, bilateral: Secondary | ICD-10-CM | POA: Diagnosis not present

## 2024-03-17 DIAGNOSIS — H6992 Unspecified Eustachian tube disorder, left ear: Secondary | ICD-10-CM | POA: Diagnosis not present

## 2024-05-25 ENCOUNTER — Other Ambulatory Visit: Payer: Self-pay

## 2024-05-25 ENCOUNTER — Encounter (HOSPITAL_COMMUNITY)
Admission: RE | Admit: 2024-05-25 | Discharge: 2024-05-25 | Disposition: A | Discharge status: Home / Self Care | Source: Ambulatory Visit | Attending: Optometry | Admitting: Optometry

## 2024-05-25 NOTE — Pre-Procedure Instructions (Signed)
 Called patient for PAT, no answer, left message.

## 2024-05-25 NOTE — Pre-Procedure Instructions (Signed)
 Attempted pre-op phone call. Left VM for him to call us back.

## 2024-05-25 NOTE — Progress Notes (Signed)
 Jacob Nash                                          MRN: [id number]   05/25/2024   The [facility] Quality Team Specialist reviewed this patient medical record for the purposes of chart review for care gap closure. The following were reviewed: chart review for care gap closure-kidney health evaluation for diabetes:eGFR  and uACR.    [facility] Quality Team

## 2024-05-25 NOTE — H&P (Signed)
 Surgical History & Physical  Patient Name: Jacob Nash  DOB: 09-25-1945  Surgery: Cataract extraction with intraocular lens implant phacoemulsification; Right Eye Surgeon: Marsa Cleverly MD Surgery Date: 05/27/2024 Pre-Op Date: 05/18/2024  HPI: A 40 Yr. old male patient referred for Cataract Evaluation in both eyes. Pt c/o decreased near vision and difficulty reading small print. He also c/o poor night vision with excessive glare from headlights and street lights. Pt denies floaters, FOL, eye pain, irritation. HPI was performed by Marsa Cleverly .  Medical History:  High Blood Pressure LDL  Review of Systems Cardiovascular High Blood Pressure All recorded systems are negative except as noted above.  Social Tobacco smoking consumption unknown  Medication Lisinopril, Alprazolam , Simvastatin   Sx/Procedures Prostate sx  Drug Allergies  NKDA  History & Physical: Heent: cataracts NECK: supple without bruits LUNGS: lungs clear to auscultation CV: regular rate and rhythm Abdomen: soft and non-tender  Impression & Plan: Assessment: 1.  CATARACT NUCLEAR SCLEROSIS AGE RELATED; Both Eyes (H25.13) 2.  DERMATOCHALASIS, no surgery; Right Upper Lid, Left Upper Lid (H02.831, H02.834) 3.  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  Posterior Vitreous Detachment; Both Eyes (H43.813)  Plan: 1.  Cataracts are visually significant and account for the patient's complaints. Discussed all risks, benefits, procedures and recovery, including infection, loss of vision and eye, need for glasses after surgery or additional procedures. Patient understands changing glasses will not improve vision. Patient indicated understanding of procedure. All questions answered. Patient desires to have surgery, recommend phacoemulsification with intraocular lens. Patient to have preliminary testing necessary (Argos/IOL Master, Mac OCT, TOPO) Educational materials  provided:Cataract.  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 Discussed may need glasses for astigmatism after surgery - Dextenza  if available  2.  Will consider surgical repair after cataract surgery  3.  Dry eye. Mild signs of dry eye at this time. Can use artificial tears QID OU PRN and warm compresses once daily as needed.  4.  No retinal tear or retinal detachment. I told the patient to contact me ASAP for new onset/worsening of floaters, flashes, or vision loss.

## 2024-05-27 ENCOUNTER — Encounter (HOSPITAL_COMMUNITY)
Admission: RE | Disposition: A | Discharge status: Home / Self Care | Payer: Self-pay | Source: Home / Self Care | Attending: Optometry

## 2024-05-27 ENCOUNTER — Other Ambulatory Visit: Payer: Self-pay

## 2024-05-27 ENCOUNTER — Encounter (HOSPITAL_COMMUNITY): Payer: Self-pay | Admitting: Optometry

## 2024-05-27 ENCOUNTER — Ambulatory Visit (HOSPITAL_COMMUNITY): Admitting: Certified Registered Nurse Anesthetist

## 2024-05-27 ENCOUNTER — Ambulatory Visit (HOSPITAL_COMMUNITY)
Admission: RE | Admit: 2024-05-27 | Discharge: 2024-05-27 | Disposition: A | Discharge status: Home / Self Care | Payer: Self-pay | Attending: Optometry | Admitting: Optometry

## 2024-05-27 DIAGNOSIS — H5711 Ocular pain, right eye: Secondary | ICD-10-CM | POA: Diagnosis present

## 2024-05-27 DIAGNOSIS — E1136 Type 2 diabetes mellitus with diabetic cataract: Secondary | ICD-10-CM | POA: Insufficient documentation

## 2024-05-27 DIAGNOSIS — H2511 Age-related nuclear cataract, right eye: Secondary | ICD-10-CM | POA: Diagnosis present

## 2024-05-27 DIAGNOSIS — K219 Gastro-esophageal reflux disease without esophagitis: Secondary | ICD-10-CM | POA: Diagnosis not present

## 2024-05-27 DIAGNOSIS — I10 Essential (primary) hypertension: Secondary | ICD-10-CM | POA: Diagnosis not present

## 2024-05-27 HISTORY — PX: INSERTION, STENT, DRUG-ELUTING, LACRIMAL CANALICULUS: SHX7453

## 2024-05-27 HISTORY — PX: CATARACT EXTRACTION W/PHACO: SHX586

## 2024-05-27 LAB — GLUCOSE, CAPILLARY: Glucose-Capillary: 116 mg/dL — ABNORMAL HIGH (ref 70–99)

## 2024-05-27 SURGERY — PHACOEMULSIFICATION, CATARACT, WITH IOL INSERTION
Anesthesia: Monitor Anesthesia Care | Site: Eye | Laterality: Right

## 2024-05-27 MED ORDER — MIDAZOLAM HCL 2 MG/2ML IJ SOLN
INTRAMUSCULAR | Status: AC
  Filled 2024-05-27: qty 2

## 2024-05-27 MED ORDER — TETRACAINE HCL 0.5 % OP SOLN
1.0000 [drp] | OPHTHALMIC | Status: AC
  Administered 2024-05-27 (×3): 1 [drp] via OPHTHALMIC

## 2024-05-27 MED ORDER — SODIUM CHLORIDE 0.9% FLUSH
INTRAVENOUS | Status: DC | PRN
  Administered 2024-05-27: 5 mL via INTRAVENOUS

## 2024-05-27 MED ORDER — POVIDONE-IODINE 5 % OP SOLN
OPHTHALMIC | Status: DC | PRN
  Administered 2024-05-27: 1 via OPHTHALMIC

## 2024-05-27 MED ORDER — PHENYLEPHRINE-KETOROLAC 1-0.3 % IO SOLN
INTRAOCULAR | Status: DC | PRN
  Administered 2024-05-27: 500 mL via OPHTHALMIC

## 2024-05-27 MED ORDER — TROPICAMIDE 1 % OP SOLN
1.0000 [drp] | OPHTHALMIC | Status: AC
  Administered 2024-05-27 (×3): 1 [drp] via OPHTHALMIC

## 2024-05-27 MED ORDER — BSS IO SOLN
INTRAOCULAR | Status: DC | PRN
  Administered 2024-05-27: 15 mL via INTRAOCULAR

## 2024-05-27 MED ORDER — STERILE WATER FOR IRRIGATION IR SOLN
Status: DC | PRN
  Administered 2024-05-27: 1

## 2024-05-27 MED ORDER — LIDOCAINE HCL 3.5 % OP GEL
1.0000 | Freq: Once | OPHTHALMIC | Status: AC
  Administered 2024-05-27: 1 via OPHTHALMIC

## 2024-05-27 MED ORDER — MIDAZOLAM HCL (PF) 2 MG/2ML IJ SOLN
INTRAMUSCULAR | Status: DC | PRN
  Administered 2024-05-27: 1 mg via INTRAVENOUS

## 2024-05-27 MED ORDER — DEXAMETHASONE 0.4 MG OP INST
VAGINAL_INSERT | OPHTHALMIC | Status: DC | PRN
  Administered 2024-05-27: .4 mg via OPHTHALMIC

## 2024-05-27 MED ORDER — SIGHTPATH DOSE#1 NA HYALUR & NA CHOND-NA HYALUR IO KIT
PACK | INTRAOCULAR | Status: DC | PRN
  Administered 2024-05-27: 1 via OPHTHALMIC

## 2024-05-27 MED ORDER — MOXIFLOXACIN HCL 5 MG/ML IO SOLN
INTRAOCULAR | Status: DC | PRN
  Administered 2024-05-27: .3 mL via INTRACAMERAL

## 2024-05-27 MED ORDER — PHENYLEPHRINE HCL 2.5 % OP SOLN
1.0000 [drp] | OPHTHALMIC | Status: AC
  Administered 2024-05-27 (×3): 1 [drp] via OPHTHALMIC

## 2024-05-27 MED ORDER — LIDOCAINE HCL (PF) 1 % IJ SOLN
INTRAMUSCULAR | Status: DC | PRN
  Administered 2024-05-27: 1 mL

## 2024-05-27 MED ORDER — DEXAMETHASONE 0.4 MG OP INST
VAGINAL_INSERT | OPHTHALMIC | Status: AC
  Filled 2024-05-27: qty 1

## 2024-05-27 SURGICAL SUPPLY — 11 items
CLOTH BEACON ORANGE TIMEOUT ST (SAFETY) ×1 IMPLANT
DRSG TEGADERM 4X4.75 (GAUZE/BANDAGES/DRESSINGS) ×1 IMPLANT
EYE SHIELD UNIVERSAL CLEAR (GAUZE/BANDAGES/DRESSINGS) IMPLANT
FEE CATARACT SUITE SIGHTPATH (MISCELLANEOUS) ×1 IMPLANT
GLOVE BIOGEL PI IND STRL 7.0 (GLOVE) ×2 IMPLANT
LENS IOL TECNIS EYHANCE 26.5 (Intraocular Lens) IMPLANT
NEEDLE HYPO 18GX1.5 BLUNT FILL (NEEDLE) ×1 IMPLANT
PAD ARMBOARD POSITIONER FOAM (MISCELLANEOUS) ×1 IMPLANT
SYR TB 1ML LL NO SAFETY (SYRINGE) ×1 IMPLANT
TAPE SURG TRANSPORE 1 IN (GAUZE/BANDAGES/DRESSINGS) IMPLANT
WATER STERILE IRR 250ML POUR (IV SOLUTION) ×1 IMPLANT

## 2024-05-27 NOTE — Discharge Instructions (Signed)
 Please discharge patient when stable, will follow up today with Dr. Ilsa Iha at the [facility] office immediately following discharge.  Leave shield in place until visit.  All paperwork with discharge instructions will be given at the office.  [facility] Address:  [address]  [place], [place] [address]  Dr. Chaya Jan Phone: [phone]

## 2024-05-27 NOTE — Interval H&P Note (Signed)
 History and Physical Interval Note:  05/27/2024 8:21 AM  Jacob Nash  has presented today for surgery, with the diagnosis of nuclear sclerotic cataract, right eye.  The various methods of treatment have been discussed with the patient and family. After consideration of risks, benefits and other options for treatment, the patient has consented to  Procedures: PHACOEMULSIFICATION, CATARACT, WITH IOL INSERTION (Right) INSERTION, STENT, DRUG-ELUTING, LACRIMAL CANALICULUS (Right) as a surgical intervention.  The patient's history has been reviewed, patient examined, no change in status, stable for surgery.  I have reviewed the patient's chart and labs.  Questions were answered to the patient's satisfaction.    The H and P was reviewed and updated. The patient was examined.  No changes were found after exam.  The surgical eye was marked.    Kelii Chittum

## 2024-05-27 NOTE — Anesthesia Preprocedure Evaluation (Signed)
"                                    Anesthesia Evaluation  Patient identified by MRN, date of birth, ID band Patient awake    Reviewed: Allergy & Precautions, H&P , NPO status , Patient's Chart, lab work & pertinent test results, reviewed documented beta blocker date and time   History of Anesthesia Complications (+) PONV and history of anesthetic complications  Airway Mallampati: II  TM Distance: >3 FB Neck ROM: full    Dental no notable dental hx.    Pulmonary neg pulmonary ROS   Pulmonary exam normal breath sounds clear to auscultation       Cardiovascular Exercise Tolerance: Good hypertension, negative cardio ROS  Rhythm:regular Rate:Normal     Neuro/Psych   Anxiety     negative neurological ROS  negative psych ROS   GI/Hepatic negative GI ROS, Neg liver ROS,GERD  ,,  Endo/Other  negative endocrine ROSdiabetes    Renal/GU Renal diseasenegative Renal ROS  negative genitourinary   Musculoskeletal   Abdominal   Peds  Hematology negative hematology ROS (+)   Anesthesia Other Findings   Reproductive/Obstetrics negative OB ROS                              Anesthesia Physical Anesthesia Plan  ASA: 2  Anesthesia Plan: MAC   Post-op Pain Management:    Induction:   PONV Risk Score and Plan:   Airway Management Planned:   Additional Equipment:   Intra-op Plan:   Post-operative Plan:   Informed Consent: I have reviewed the patients History and Physical, chart, labs and discussed the procedure including the risks, benefits and alternatives for the proposed anesthesia with the patient or authorized representative who has indicated his/her understanding and acceptance.     Dental Advisory Given  Plan Discussed with: CRNA  Anesthesia Plan Comments:         Anesthesia Quick Evaluation  "

## 2024-05-27 NOTE — Transfer of Care (Signed)
 Immediate Anesthesia Transfer of Care Note  Patient: Jacob Nash  Procedure(s) Performed: PHACOEMULSIFICATION, CATARACT, WITH IOL INSERTION (Right: Eye) INSERTION, STENT, DRUG-ELUTING, LACRIMAL CANALICULUS (Right: Eye)  Patient Location: Short Stay  Anesthesia Type:MAC  Level of Consciousness: awake, alert , and oriented  Airway & Oxygen  Therapy: Patient Spontanous Breathing  Post-op Assessment: Report given to RN and Post -op Vital signs reviewed and stable  Post vital signs: Reviewed and stable  Last Vitals:  Vitals Value Taken Time  BP 121/75 05/27/24 09:19  Temp 36.4 C 05/27/24 09:19  Pulse 72 05/27/24 09:19  Resp 15 05/27/24 09:19  SpO2 100 % 05/27/24 09:19    Last Pain:  Vitals:   05/27/24 0919  TempSrc: Oral  PainSc: 0-No pain         Complications: No notable events documented.

## 2024-05-27 NOTE — Op Note (Signed)
 Date of procedure: 05/27/2024  Pre-operative diagnosis: Visually significant age-related nuclear cataract, Right Eye (H25.11)  Post-operative diagnosis: Visually significant age-related nuclear cataract, Right Eye H25.11; Ocular Pain and Inflammation, Right eye H57.11  Procedure: Removal of cataract via phacoemulsification and insertion of intra-ocular lens J&J DIBOO +26.5D into the capsular bag of the Right Eye, Dextenza  Implantation into right lower punctum CPT [id number]  Attending surgeon: Marsa Cleverly, MD  Anesthesia: MAC, Topical Akten   Complications: None  Estimated Blood Loss: <9mL (minimal)  Specimens: None  Implants:  Implant Name Type Inv. Item Serial No. Manufacturer Lot No. LRB No. Used Action  LENS IOL TECNIS EYHANCE 26.5 - [id number] Intraocular Lens LENS IOL TECNIS EYHANCE 26.5 [id number] SIGHTPATH  Right 1 Implanted    Indications:  Visually significant age-related cataract, Right Eye  Procedure:  The patient was seen and identified in the pre-operative area. The operative eye was identified and dilated.  The operative eye was marked.  Topical anesthesia was administered to the operative eye.     The patient was then to the operative suite and placed in the supine position.  A timeout was performed confirming the patient, procedure to be performed, and all other relevant information.   The patient's face was prepped and draped in the usual fashion for intra-ocular surgery.  A lid speculum was placed into the operative eye and the surgical microscope moved into place and focused.  A superotemporal paracentesis was created using a 20 gauge paracentesis blade.  BSS mixed with Omidria , followed by 1% lidocaine  was injected into the anterior chamber.  Viscoelastic was injected into the anterior chamber.  A temporal clear-corneal main wound incision was created using a 2.25mm microkeratome.  A continuous curvilinear capsulorrhexis was initiated using an irrigating cystitome and  completed using capsulorrhexis forceps.  Hydrodissection and hydrodeliniation were performed.  Viscoelastic was injected into the anterior chamber.  A phacoemulsification handpiece and a chopper as a second instrument were used to remove the nucleus and epinucleus. The irrigation/aspiration handpiece was used to remove any remaining cortical material.   The capsular bag was reinflated with viscoelastic, checked, and found to be intact.  The intraocular lens was inserted into the capsular bag.  The irrigation/aspiration handpiece was used to remove any remaining viscoelastic.  The clear corneal wound and paracentesis wounds were then hydrated and checked with Weck-Cels to be watertight. Moxifloxacin  was instilled into the anterior chamber.  The lid-speculum and drape were removed. The lower punctum was dilated, and the dextenza  implant was inserted into it. The patient's face was cleaned with a wet and dry 4x4. A clear shield was taped over the eye. The patient was taken to the post-operative care unit in good condition, having tolerated the procedure well.  Post-Op Instructions: The patient will follow up at [facility] for a same day post-operative evaluation and will receive all other orders and instructions.

## 2024-05-28 NOTE — Anesthesia Postprocedure Evaluation (Signed)
"   Anesthesia Post Note  Patient: Jacob Nash  Procedure(s) Performed: PHACOEMULSIFICATION, CATARACT, WITH IOL INSERTION (Right: Eye) INSERTION, STENT, DRUG-ELUTING, LACRIMAL CANALICULUS (Right: Eye)  Patient location during evaluation: Phase II Anesthesia Type: MAC Level of consciousness: awake Pain management: pain level controlled Vital Signs Assessment: post-procedure vital signs reviewed and stable Respiratory status: spontaneous breathing and respiratory function stable Cardiovascular status: blood pressure returned to baseline and stable Postop Assessment: no headache and no apparent nausea or vomiting Anesthetic complications: no Comments: Late entry   No notable events documented.   Last Vitals:  Vitals:   05/27/24 0757 05/27/24 0919  BP: 123/73 121/75  Pulse: 76 72  Resp: 16 15  Temp: 36.6 C 36.4 C  SpO2: 98% 100%    Last Pain:  Vitals:   05/27/24 0919  TempSrc: Oral  PainSc: 0-No pain                 Yvonna PARAS Rikita Grabert      "

## 2024-05-30 ENCOUNTER — Encounter (HOSPITAL_COMMUNITY): Payer: Self-pay | Admitting: Optometry

## 2024-06-06 NOTE — H&P (Signed)
 Surgical History & Physical  Patient Name: Jacob Nash  DOB: 1946-04-03  Surgery: Cataract extraction with intraocular lens implant phacoemulsification; Left Eye Surgeon: Marsa Cleverly MD Surgery Date: 06/10/2024 Pre-Op Date: 06/01/2024  HPI: A 16 Yr. old male patient 1. The patient is returning after cataract surgery. The right eye is affected. Status post cataract surgery, which began 5 days ago: Since the last visit, the affected area is stable. The patient's vision is blurry. The condition's severity is constant. Patient is following medication instructions. He states the od is blurry and will want it to be clear up so he can see.  Medical History:  High Blood Pressure LDL  Review of Systems Cardiovascular High Blood Pressure All recorded systems are negative except as noted above.  Social Tobacco smoking consumption unknown  Medication Prednisolone-moxiflox-bromfen,  Lisinopril, Alprazolam , Simvastatin   Sx/Procedures Phaco c IOL OD-dex,  Prostate sx   Drug Allergies  NKDA  History & Physical: Heent: cataract NECK: supple without bruits LUNGS: lungs clear to auscultation CV: regular rate and rhythm Abdomen: soft and non-tender  Impression & Plan: Assessment: 1.  CATARACT NUCLEAR SCLEROSIS AGE RELATED; Both Eyes (H25.13) 2.  CATARACT EXTRACTION STATUS; Right Eye (Z98.41) 3.  INTRAOCULAR LENS IOL (Z96.1)  Plan: 1.  Cataracts are visually significant and account for the patient's complaints. Discussed all risks, benefits, procedures and recovery, including infection, loss of vision and eye, need for glasses after surgery or additional procedures. Patient understands changing glasses will not improve vision. Patient indicated understanding of procedure. All questions answered. Patient desires to have surgery, recommend phacoemulsification with intraocular lens. Patient to have preliminary testing necessary (Argos/IOL Master, Mac OCT, TOPO) Educational materials  provided:Cataract.  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 Discussed may need glasses for astigmatism after surgery - Dextenza  if available  2.  CE/PCIOL OD 05/27/24  POD5. Doing well. All post-op precautions discussed and instructions reviewed. Written instructions given.  Low myopia on exam with some residual edema. Will recheck next week  3.  See above

## 2024-06-07 ENCOUNTER — Encounter (HOSPITAL_COMMUNITY)
Admission: RE | Admit: 2024-06-07 | Discharge: 2024-06-07 | Disposition: A | Discharge status: Home / Self Care | Source: Ambulatory Visit | Attending: Optometry | Admitting: Optometry

## 2024-07-14 ENCOUNTER — Other Ambulatory Visit (HOSPITAL_COMMUNITY)

## 2024-07-19 ENCOUNTER — Ambulatory Visit (HOSPITAL_COMMUNITY): Admission: RE | Admit: 2024-07-19 | Source: Home / Self Care | Admitting: Optometry

## 2024-07-19 ENCOUNTER — Encounter (HOSPITAL_COMMUNITY): Admission: RE | Payer: Self-pay | Source: Home / Self Care

## 2024-07-19 SURGERY — PHACOEMULSIFICATION, CATARACT, WITH IOL INSERTION
Anesthesia: Monitor Anesthesia Care | Laterality: Left
